# Patient Record
Sex: Male | Born: 1960 | Race: White | Hispanic: No | Marital: Married | Smoking: Never smoker
Health system: Southern US, Community
[De-identification: ages and names within clinical notes are randomized; demographics above are authoritative.]

## PROBLEM LIST (undated history)

## (undated) DIAGNOSIS — T7840XA Allergy, unspecified, initial encounter: Secondary | ICD-10-CM

## (undated) DIAGNOSIS — N529 Male erectile dysfunction, unspecified: Secondary | ICD-10-CM

## (undated) DIAGNOSIS — I10 Essential (primary) hypertension: Secondary | ICD-10-CM

## (undated) DIAGNOSIS — M779 Enthesopathy, unspecified: Secondary | ICD-10-CM

## (undated) DIAGNOSIS — K219 Gastro-esophageal reflux disease without esophagitis: Secondary | ICD-10-CM

## (undated) HISTORY — DX: Gastro-esophageal reflux disease without esophagitis: K21.9

## (undated) HISTORY — DX: Allergy, unspecified, initial encounter: T78.40XA

## (undated) HISTORY — PX: TONSILLECTOMY AND ADENOIDECTOMY: SUR1326

## (undated) HISTORY — PX: BICEPS TENDON REPAIR: SHX566

## (undated) HISTORY — PX: OTHER SURGICAL HISTORY: SHX169

## (undated) HISTORY — PX: COLONOSCOPY: SHX174

## (undated) HISTORY — DX: Essential (primary) hypertension: I10

## (undated) HISTORY — DX: Enthesopathy, unspecified: M77.9

## (undated) HISTORY — PX: ROTATOR CUFF REPAIR: SHX139

## (undated) HISTORY — DX: Male erectile dysfunction, unspecified: N52.9

---

## 2006-08-13 ENCOUNTER — Ambulatory Visit (HOSPITAL_BASED_OUTPATIENT_CLINIC_OR_DEPARTMENT_OTHER): Admission: RE | Admit: 2006-08-13 | Discharge: 2006-08-13 | Payer: Self-pay | Admitting: Urology

## 2006-08-13 ENCOUNTER — Encounter (INDEPENDENT_AMBULATORY_CARE_PROVIDER_SITE_OTHER): Payer: Self-pay | Admitting: Specialist

## 2010-07-13 ENCOUNTER — Ambulatory Visit (HOSPITAL_BASED_OUTPATIENT_CLINIC_OR_DEPARTMENT_OTHER): Admission: RE | Admit: 2010-07-13 | Discharge: 2010-07-13 | Payer: Self-pay | Admitting: Urology

## 2010-11-20 LAB — POCT I-STAT, CHEM 8
BUN: 14 mg/dL (ref 6–23)
Creatinine, Ser: 0.9 mg/dL (ref 0.4–1.5)
Glucose, Bld: 116 mg/dL — ABNORMAL HIGH (ref 70–99)
Hemoglobin: 16 g/dL (ref 13.0–17.0)
Potassium: 3.9 mEq/L (ref 3.5–5.1)
Sodium: 138 mEq/L (ref 135–145)
TCO2: 26 mmol/L (ref 0–100)

## 2011-01-25 NOTE — Op Note (Signed)
NAMETEREL, BANN NO.:  [id number]   MEDICAL RECORD NO.:  [id number]          PATIENT TYPE:  AMB   LOCATION:  [facility]                         FACILITY:  [facility]   PHYSICIAN:  Maretta Bees. Vonita Moss, M.D.DATE OF BIRTH:  06-15-1961   DATE OF PROCEDURE:  08/13/2006  DATE OF DISCHARGE:                               OPERATIVE REPORT   PREOPERATIVE DIAGNOSIS:  Penile and thigh warts.   POSTOPERATIVE DIAGNOSIS:  Penile and thigh warts.   PROCEDURE:  Excision and CO2 laser of penile and thigh warts.   SURGEON:  Dr. Larey Dresser.   ANESTHESIA:  General.   INDICATIONS:  This gentleman has had a long history of warts on the  dorsum of the penis and other warts in his inner thighs that bother him  and he wished removal.   PROCEDURE:  The patient brought to the operating room, placed lithotomy  position.  The external genitalia and thighs were prepped with CHD .  With towels were placed in preparation for laser therapy.  Two 5 mm  warts on the dorsum of the penis were excised sharply and the incisional  sites closed with 4-0 chromic catgut.  There was another wart at the  base of the penis on the right that measured about 6 mm in size and was  treated with CO2 laser.  10 other warts on both inner thighs were then  treated with the CO2 laser and completely destroyed, The biopsy and  laser sites were then covered with antibiotic ointment and the patient  was taken to recovery in good condition with essentially no blood loss  and having tolerated the procedure well.      Maretta Bees. Vonita Moss, M.D.  Electronically Signed     LJP/MEDQ  D:  08/13/2006  T:  08/13/2006  Job:  [id number]

## 2011-09-19 ENCOUNTER — Ambulatory Visit (INDEPENDENT_AMBULATORY_CARE_PROVIDER_SITE_OTHER): Payer: 59

## 2011-09-19 DIAGNOSIS — J019 Acute sinusitis, unspecified: Secondary | ICD-10-CM

## 2011-09-19 DIAGNOSIS — R05 Cough: Secondary | ICD-10-CM

## 2011-09-19 DIAGNOSIS — J40 Bronchitis, not specified as acute or chronic: Secondary | ICD-10-CM

## 2011-10-25 ENCOUNTER — Encounter: Payer: Self-pay | Admitting: *Deleted

## 2011-10-25 DIAGNOSIS — M779 Enthesopathy, unspecified: Secondary | ICD-10-CM

## 2011-10-28 ENCOUNTER — Encounter: Payer: Self-pay | Admitting: Internal Medicine

## 2012-03-09 ENCOUNTER — Encounter: Payer: 59 | Admitting: Internal Medicine

## 2012-06-22 ENCOUNTER — Encounter: Payer: 59 | Admitting: Internal Medicine

## 2012-08-18 ENCOUNTER — Ambulatory Visit (INDEPENDENT_AMBULATORY_CARE_PROVIDER_SITE_OTHER): Payer: 59 | Admitting: Internal Medicine

## 2012-08-18 VITALS — BP 132/84 | HR 74 | Temp 98.1°F | Resp 18 | Ht 63.75 in | Wt 200.0 lb

## 2012-08-18 DIAGNOSIS — J329 Chronic sinusitis, unspecified: Secondary | ICD-10-CM

## 2012-08-18 DIAGNOSIS — R05 Cough: Secondary | ICD-10-CM

## 2012-08-18 DIAGNOSIS — Z0000 Encounter for general adult medical examination without abnormal findings: Secondary | ICD-10-CM

## 2012-08-18 DIAGNOSIS — R059 Cough, unspecified: Secondary | ICD-10-CM

## 2012-08-18 DIAGNOSIS — J40 Bronchitis, not specified as acute or chronic: Secondary | ICD-10-CM

## 2012-08-18 LAB — COMPREHENSIVE METABOLIC PANEL
ALT: 18 U/L (ref 0–53)
AST: 20 U/L (ref 0–37)
Albumin: 4.7 g/dL (ref 3.5–5.2)
Alkaline Phosphatase: 60 U/L (ref 39–117)
Potassium: 4 mEq/L (ref 3.5–5.3)
Sodium: 134 mEq/L — ABNORMAL LOW (ref 135–145)
Total Bilirubin: 1.3 mg/dL — ABNORMAL HIGH (ref 0.3–1.2)
Total Protein: 6.9 g/dL (ref 6.0–8.3)

## 2012-08-18 LAB — TSH: TSH: 4.424 u[IU]/mL (ref 0.350–4.500)

## 2012-08-18 LAB — LIPID PANEL
Cholesterol: 194 mg/dL (ref 0–200)
LDL Cholesterol: 109 mg/dL — ABNORMAL HIGH (ref 0–99)
Total CHOL/HDL Ratio: 3.2 Ratio
VLDL: 25 mg/dL (ref 0–40)

## 2012-08-18 LAB — POCT CBC
Granulocyte percent: 79.6 %G (ref 37–80)
Lymph, poc: 1.4 (ref 0.6–3.4)
MCHC: 32.1 g/dL (ref 31.8–35.4)
MPV: 8.6 fL (ref 0–99.8)
POC Granulocyte: 8.7 — AB (ref 2–6.9)
POC LYMPH PERCENT: 12.6 %L (ref 10–50)
POC MID %: 7.8 %M (ref 0–12)
Platelet Count, POC: 288 10*3/uL (ref 142–424)
RDW, POC: 12.1 %

## 2012-08-18 MED ORDER — HYDROCODONE-ACETAMINOPHEN 7.5-325 MG/15ML PO SOLN: 5.0000 mL | Freq: Four times a day (QID) | ORAL | Status: DC | PRN

## 2012-08-18 MED ORDER — AZITHROMYCIN 500 MG PO TABS: 500.0000 mg | ORAL_TABLET | Freq: Every day | ORAL | Status: DC

## 2012-08-18 NOTE — Patient Instructions (Signed)
Sinusitis Sinusitis is redness, soreness, and swelling (inflammation) of the paranasal sinuses. Paranasal sinuses are air pockets within the bones of your face (beneath the eyes, the middle of the forehead, or above the eyes). In healthy paranasal sinuses, mucus is able to drain out, and air is able to circulate through them by way of your nose. However, when your paranasal sinuses are inflamed, mucus and air can become trapped. This can allow bacteria and other germs to grow and cause infection. Sinusitis can develop quickly and last only a short time (acute) or continue over a long period (chronic). Sinusitis that lasts for more than 12 weeks is considered chronic.  CAUSES  Causes of sinusitis include:  Allergies.  Structural abnormalities, such as displacement of the cartilage that separates your nostrils (deviated septum), which can decrease the air flow through your nose and sinuses and affect sinus drainage.  Functional abnormalities, such as when the small hairs (cilia) that line your sinuses and help remove mucus do not work properly or are not present. SYMPTOMS  Symptoms of acute and chronic sinusitis are the same. The primary symptoms are pain and pressure around the affected sinuses. Other symptoms include:  Upper toothache.  Earache.  Headache.  Bad breath.  Decreased sense of smell and taste.  A cough, which worsens when you are lying flat.  Fatigue.  Fever.  Thick drainage from your nose, which often is green and may contain pus (purulent).  Swelling and warmth over the affected sinuses. DIAGNOSIS  Your caregiver will perform a physical exam. During the exam, your caregiver may:  Look in your nose for signs of abnormal growths in your nostrils (nasal polyps).  Tap over the affected sinus to check for signs of infection.  View the inside of your sinuses (endoscopy) with a special imaging device with a light attached (endoscope), which is inserted into your  sinuses. If your caregiver suspects that you have chronic sinusitis, one or more of the following tests may be recommended:  Allergy tests.  Nasal culture A sample of mucus is taken from your nose and sent to a lab and screened for bacteria.  Nasal cytology A sample of mucus is taken from your nose and examined by your caregiver to determine if your sinusitis is related to an allergy. TREATMENT  Most cases of acute sinusitis are related to a viral infection and will resolve on their own within 10 days. Sometimes medicines are prescribed to help relieve symptoms (pain medicine, decongestants, nasal steroid sprays, or saline sprays).  However, for sinusitis related to a bacterial infection, your caregiver will prescribe antibiotic medicines. These are medicines that will help kill the bacteria causing the infection.  Rarely, sinusitis is caused by a fungal infection. In theses cases, your caregiver will prescribe antifungal medicine. For some cases of chronic sinusitis, surgery is needed. Generally, these are cases in which sinusitis recurs more than 3 times per year, despite other treatments. HOME CARE INSTRUCTIONS   Drink plenty of water. Water helps thin the mucus so your sinuses can drain more easily.  Use a humidifier.  Inhale steam 3 to 4 times a day (for example, sit in the bathroom with the shower running).  Apply a warm, moist washcloth to your face 3 to 4 times a day, or as directed by your caregiver.  Use saline nasal sprays to help moisten and clean your sinuses.  Take over-the-counter or prescription medicines for pain, discomfort, or fever only as directed by your caregiver. SEEK IMMEDIATE MEDICAL   CARE IF:  You have increasing pain or severe headaches.  You have nausea, vomiting, or drowsiness.  You have swelling around your face.  You have vision problems. You have a stiff neck.Bronchitis Bronchitis is the body's way of reacting to injury and/or infection  (inflammation) of the bronchi. Bronchi are the air tubes that extend from the windpipe into the lungs. If the inflammation becomes severe, it may cause shortness of breath. CAUSES  Inflammation may be caused by: A virus. Germs (bacteria). Dust. Allergens. Pollutants and many other irritants. The cells lining the bronchial tree are covered with tiny hairs (cilia). These constantly beat upward, away from the lungs, toward the mouth. This keeps the lungs free of pollutants. When these cells become too irritated and are unable to do their job, mucus begins to develop. This causes the characteristic cough of bronchitis. The cough clears the lungs when the cilia are unable to do their job. Without either of these protective mechanisms, the mucus would settle in the lungs. Then you would develop pneumonia. Smoking is a common cause of bronchitis and can contribute to pneumonia. Stopping this habit is the single most important thing you can do to help yourself. TREATMENT  Your caregiver may prescribe an antibiotic if the cough is caused by bacteria. Also, medicines that open up your airways make it easier to breathe. Your caregiver may also recommend or prescribe an expectorant. It will loosen the mucus to be coughed up. Only take over-the-counter or prescription medicines for pain, discomfort, or fever as directed by your caregiver. Removing whatever causes the problem (smoking, for example) is critical to preventing the problem from getting worse. Cough suppressants may be prescribed for relief of cough symptoms. Inhaled medicines may be prescribed to help with symptoms now and to help prevent problems from returning. For those with recurrent (chronic) bronchitis, there may be a need for steroid medicines. SEEK IMMEDIATE MEDICAL CARE IF:  During treatment, you develop more pus-like mucus (purulent sputum). You have a fever. Your baby is older than 3 months with a rectal temperature of 102 F (38.9 C)  or higher. Your baby is 34 months old or younger with a rectal temperature of 100.4 F (38 C) or higher. You become progressively more ill. You have increased difficulty breathing, wheezing, or shortness of breath. It is necessary to seek immediate medical care if you are elderly or sick from any other disease. MAKE SURE YOU:  Understand these instructions. Will watch your condition. Will get help right away if you are not doing well or get worse. Document Released: 08/26/2005 Document Revised: 11/18/2011 Document Reviewed: 07/05/2008 [facility] Patient Information 2013 [place], [place].    You have difficulty breathing. MAKE SURE YOU:   Understand these instructions.  Will watch your condition.  Will get help right away if you are not doing well or get worse. Document Released: 08/26/2005 Document Revised: 11/18/2011 Document Reviewed: 09/10/2011 [facility] Patient Information 2013 [place], [place].

## 2012-08-18 NOTE — Progress Notes (Signed)
  Subjective:    Patient ID: Neva Seat, male    DOB: 1960/09/23, 51 y.o.   MRN: [id number]  HPI Cough with green sputum,facial pain and foul sinus discharge. No sob, cp, dizzyness.   Review of Systems     Objective:   Physical Exam  Constitutional: He is oriented to person, place, and time. He appears well-developed and well-nourished.  HENT:  Right Ear: External ear normal.  Left Ear: External ear normal.  Nose: Mucosal edema, rhinorrhea and sinus tenderness present. Right sinus exhibits maxillary sinus tenderness and frontal sinus tenderness. Left sinus exhibits maxillary sinus tenderness and frontal sinus tenderness.  Mouth/Throat: Oropharynx is clear and moist.  Neck: Normal range of motion.  Cardiovascular: Normal rate, regular rhythm and normal heart sounds.   Pulmonary/Chest: Effort normal. He has no decreased breath sounds. He has no wheezes. He has rhonchi in the right upper field, the left upper field and the left lower field.  Musculoskeletal: Normal range of motion.  Neurological: He is alert and oriented to person, place, and time.  Skin: Skin is warm.  Psychiatric: He has a normal mood and affect.          Assessment & Plan:  Sinusitis/bronchitis CPE Friday/ do lab work today

## 2012-08-21 ENCOUNTER — Ambulatory Visit (INDEPENDENT_AMBULATORY_CARE_PROVIDER_SITE_OTHER): Payer: 59 | Admitting: Internal Medicine

## 2012-08-21 VITALS — BP 112/72 | HR 70 | Temp 98.0°F | Resp 18 | Ht 68.0 in | Wt 200.0 lb

## 2012-08-21 DIAGNOSIS — Z7189 Other specified counseling: Secondary | ICD-10-CM

## 2012-08-21 DIAGNOSIS — N529 Male erectile dysfunction, unspecified: Secondary | ICD-10-CM

## 2012-08-21 DIAGNOSIS — I10 Essential (primary) hypertension: Secondary | ICD-10-CM

## 2012-08-21 DIAGNOSIS — J40 Bronchitis, not specified as acute or chronic: Secondary | ICD-10-CM

## 2012-08-21 DIAGNOSIS — Z0000 Encounter for general adult medical examination without abnormal findings: Secondary | ICD-10-CM

## 2012-08-21 HISTORY — DX: Essential (primary) hypertension: I10

## 2012-08-21 LAB — POCT URINALYSIS DIPSTICK
Ketones, UA: NEGATIVE
Leukocytes, UA: NEGATIVE
Protein, UA: NEGATIVE
Urobilinogen, UA: 0.2
pH, UA: 5.5

## 2012-08-21 LAB — POCT UA - MICROSCOPIC ONLY
Casts, Ur, LPF, POC: NEGATIVE
Mucus, UA: NEGATIVE
Yeast, UA: NEGATIVE

## 2012-08-21 MED ORDER — SILDENAFIL CITRATE 100 MG PO TABS: 100.0000 mg | ORAL_TABLET | ORAL | Status: DC | PRN

## 2012-08-21 MED ORDER — LISINOPRIL 20 MG PO TABS: 20.0000 mg | ORAL_TABLET | Freq: Every day | ORAL | Status: DC

## 2012-08-21 NOTE — Progress Notes (Signed)
  Subjective:    Patient ID: Blake Smith, male    DOB: 01/07/1961, 51 y.o.   MRN: [id number]  HPI Lost 40 lbs, feels great. Cholesterol was high last year, now it is normal! Bronchitis is resolving.   Review of Systems  Constitutional: Negative.   HENT: Negative.   Eyes: Negative.   Respiratory: Negative.   Cardiovascular: Negative.   Gastrointestinal: Negative.   Genitourinary: Negative.   Musculoskeletal: Negative.   Neurological: Negative.   Hematological: Negative.   Psychiatric/Behavioral: Negative.        Objective:   Physical Exam  Vitals reviewed. Constitutional: He is oriented to person, place, and time. He appears well-developed and well-nourished.  HENT:  Right Ear: External ear normal.  Left Ear: External ear normal.  Nose: Nose normal.  Mouth/Throat: Oropharynx is clear and moist.  Eyes: EOM are normal. Pupils are equal, round, and reactive to light.  Neck: Normal range of motion. No tracheal deviation present. No thyromegaly present.  Cardiovascular: Normal rate, regular rhythm, normal heart sounds and intact distal pulses.   Pulmonary/Chest: Effort normal and breath sounds normal.  Abdominal: Soft. He exhibits no mass. There is no tenderness.  Genitourinary: Rectum normal, prostate normal and penis normal.  Musculoskeletal: Normal range of motion.  Neurological: He is alert and oriented to person, place, and time. He has normal reflexes. No cranial nerve deficit. He exhibits normal muscle tone. Coordination normal.  Skin: Skin is warm. No rash noted.  Psychiatric: He has a normal mood and affect. His behavior is normal. Judgment and thought content normal.    EKG      Assessment & Plan:  Lab work reviewed and is normal RF viagra, dced hctz Lisinopril 20mg  qd new bp med

## 2012-08-21 NOTE — Patient Instructions (Addendum)

## 2012-09-10 ENCOUNTER — Ambulatory Visit (INDEPENDENT_AMBULATORY_CARE_PROVIDER_SITE_OTHER): Payer: 59 | Admitting: Physician Assistant

## 2012-09-10 VITALS — BP 142/86 | HR 94 | Temp 99.3°F | Resp 18 | Ht 68.0 in | Wt 209.4 lb

## 2012-09-10 DIAGNOSIS — J3489 Other specified disorders of nose and nasal sinuses: Secondary | ICD-10-CM

## 2012-09-10 DIAGNOSIS — R05 Cough: Secondary | ICD-10-CM

## 2012-09-10 DIAGNOSIS — R0981 Nasal congestion: Secondary | ICD-10-CM

## 2012-09-10 DIAGNOSIS — J329 Chronic sinusitis, unspecified: Secondary | ICD-10-CM

## 2012-09-10 LAB — POCT INFLUENZA A/B: Influenza A, POC: NEGATIVE

## 2012-09-10 MED ORDER — LEVOFLOXACIN 500 MG PO TABS: 500.0000 mg | ORAL_TABLET | Freq: Every day | ORAL | Status: DC

## 2012-09-10 MED ORDER — BENZONATATE 200 MG PO CAPS: 200.0000 mg | ORAL_CAPSULE | Freq: Three times a day (TID) | ORAL | Status: DC | PRN

## 2012-09-10 MED ORDER — IPRATROPIUM BROMIDE 0.06 % NA SOLN: 2.0000 | Freq: Three times a day (TID) | NASAL | Status: DC

## 2012-09-10 NOTE — Progress Notes (Signed)
Patient ID: TEJAS SEAWOOD MRN: [id number], DOB: Jul 17, 1961, 52 y.o. Date of Encounter: 09/10/2012, 12:16 PM  Primary Physician: Tally Due, MD  Chief Complaint:  Chief Complaint  Patient presents with  . Cough    1 week  . Sinusitis    HPI: 52 y.o. year old male presents with a seven day history of nasal congestion, post nasal drip, sore throat, sinus pressure, and cough. Afebrile. No chills. Nasal congestion thick and green/yellow. Cough is productive of green/yellow sputum and not associated with time of day. No wheezing, shortness of breath, or chest pain. Increasing sinus pressure. Ears feel full, leading to sensation of muffled hearing. Has tried OTC cold preps without success. No GI complaints. Appetite normal. Several sick contacts, some with influenza. No recent antibiotics or recent travels. No leg trauma, sedentary periods, h/o cancer, or tobacco use.  Past Medical History  Diagnosis Date  . Hypertension   . Erectile dysfunction   . Bone spur     dorssal distal  R foot     Home Meds: Prior to Admission medications   Medication Sig Start Date End Date Taking? Authorizing Provider  lisinopril (PRINIVIL,ZESTRIL) 20 MG tablet Take 1 tablet (20 mg total) by mouth daily. 08/21/12  Yes Jonita Albee, MD  sildenafil (VIAGRA) 100 MG tablet Take 1 tablet (100 mg total) by mouth as needed. 08/21/12  Yes Jonita Albee, MD                                Allergies: No Known Allergies  History   Social History  . Marital Status: Single    Spouse Name: N/A    Number of Children: N/A  . Years of Education: N/A   Occupational History  . firefighter [employer]   Social History Main Topics  . Smoking status: Never Smoker   . Smokeless tobacco: Not on file  . Alcohol Use: 0.5 oz/week    1 drink(s) per week  . Drug Use: No  . Sexually Active: Yes   Other Topics Concern  . Not on file   Social History Narrative  . No narrative on file     Review  of Systems: Constitutional: negative for chills, fever, night sweats or weight changes HEENT: see above Cardiovascular: negative for chest pain or palpitations Respiratory: positive for cough. negative for hemoptysis, wheezing, or shortness of breath Abdominal: negative for abdominal pain, nausea, vomiting or diarrhea Dermatological: negative for rash Neurologic: negative for headache   Physical Exam: Blood pressure 142/86, pulse 94, temperature 99.3 F (37.4 C), temperature source Oral, resp. rate 18, height 5\' 8"  (1.727 m), weight 209 lb 6.4 oz (94.983 kg), SpO2 99.00%., Body mass index is 31.84 kg/(m^2). General: Well developed, well nourished, in no acute distress. Head: Normocephalic, atraumatic, eyes without discharge, sclera non-icteric, nares are congested. Bilateral auditory canals clear, TM's are without perforation, pearly grey with reflective cone of light bilaterally. Serous effusion bilaterally behind TM's. Maxillary sinus TTP. Oral cavity moist, dentition normal. Posterior pharynx with post nasal drip and mild erythema. No peritonsillar abscess or tonsillar exudate. Uvula midline Neck: Supple. No thyromegaly. Full ROM. No lymphadenopathy. Lungs: Coarse breath sounds bilaterally without wheezes, rales, or rhonchi. Breathing is unlabored.  Heart: RRR with S1 S2. No murmurs, rubs, or gallops appreciated. Msk:  Strength and tone normal for age. Extremities: No clubbing or cyanosis. No edema. Neuro: Alert and oriented X 3.  Moves all extremities spontaneously. CNII-XII grossly in tact. Psych:  Responds to questions appropriately with a normal affect.   Labs: Results for orders placed in visit on 09/10/12  POCT INFLUENZA A/B      Component Value Range   Influenza A, POC Negative     Influenza B, POC Negative       ASSESSMENT AND PLAN:  52 y.o. year old male with sinobronchitis, cough, and nasal congesiton -Levaquin 500 mg 1 po daily #10 no RF -Atrovent NS 0.06% 2 sprays  each nare bid prn #1 no RF -Tessalon Perles 200 mg 1 po tid prn cough #40 no RF  -Mucinex -Tylenol/Motrin prn -Rest/fluids -RTC precautions -RTC 3-5 days if no improvement  Elinor Dodge, PA-C 09/10/2012 12:16 PM

## 2012-10-02 ENCOUNTER — Telehealth: Payer: Self-pay | Admitting: Radiology

## 2012-10-02 NOTE — Telephone Encounter (Signed)
Patient was here for his physical with you last month, had lost 40 lbs, was feeling good. 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

## 2012-10-02 NOTE — Telephone Encounter (Signed)
Dr Perrin Maltese has signed form, I have faxed it and mailed it to the patient.

## 2012-11-28 ENCOUNTER — Emergency Department (HOSPITAL_COMMUNITY)
Admission: EM | Admit: 2012-11-28 | Discharge: 2012-11-28 | Disposition: A | Discharge status: Home / Self Care | Payer: 59 | Attending: Emergency Medicine | Admitting: Emergency Medicine

## 2012-11-28 ENCOUNTER — Other Ambulatory Visit: Payer: Self-pay

## 2012-11-28 ENCOUNTER — Encounter (HOSPITAL_COMMUNITY): Payer: Self-pay | Admitting: Unknown Physician Specialty

## 2012-11-28 ENCOUNTER — Emergency Department (HOSPITAL_COMMUNITY): Payer: 59

## 2012-11-28 DIAGNOSIS — Z8739 Personal history of other diseases of the musculoskeletal system and connective tissue: Secondary | ICD-10-CM | POA: Insufficient documentation

## 2012-11-28 DIAGNOSIS — R61 Generalized hyperhidrosis: Secondary | ICD-10-CM | POA: Insufficient documentation

## 2012-11-28 DIAGNOSIS — Z79899 Other long term (current) drug therapy: Secondary | ICD-10-CM | POA: Insufficient documentation

## 2012-11-28 DIAGNOSIS — R0789 Other chest pain: Secondary | ICD-10-CM | POA: Insufficient documentation

## 2012-11-28 DIAGNOSIS — Z87448 Personal history of other diseases of urinary system: Secondary | ICD-10-CM | POA: Insufficient documentation

## 2012-11-28 DIAGNOSIS — R42 Dizziness and giddiness: Secondary | ICD-10-CM | POA: Insufficient documentation

## 2012-11-28 DIAGNOSIS — I10 Essential (primary) hypertension: Secondary | ICD-10-CM | POA: Insufficient documentation

## 2012-11-28 LAB — PROTIME-INR
INR: 0.95 (ref 0.00–1.49)
Prothrombin Time: 12.6 seconds (ref 11.6–15.2)

## 2012-11-28 LAB — POCT I-STAT, CHEM 8
BUN: 14 mg/dL (ref 6–23)
Calcium, Ion: 1.19 mmol/L (ref 1.12–1.23)
Chloride: 107 mEq/L (ref 96–112)
HCT: 48 % (ref 39.0–52.0)
Potassium: 4.4 mEq/L (ref 3.5–5.1)
Sodium: 141 mEq/L (ref 135–145)

## 2012-11-28 LAB — CBC WITH DIFFERENTIAL/PLATELET
Basophils Relative: 0 % (ref 0–1)
Lymphs Abs: 0.8 10*3/uL (ref 0.7–4.0)
MCH: 32.3 pg (ref 26.0–34.0)
MCV: 86.8 fL (ref 78.0–100.0)
Monocytes Absolute: 0.6 10*3/uL (ref 0.1–1.0)
Monocytes Relative: 6 % (ref 3–12)
Neutro Abs: 7.9 10*3/uL — ABNORMAL HIGH (ref 1.7–7.7)
Platelets: 188 10*3/uL (ref 150–400)
RDW: 12 % (ref 11.5–15.5)
WBC: 9.4 10*3/uL (ref 4.0–10.5)

## 2012-11-28 LAB — POCT I-STAT TROPONIN I: Troponin i, poc: 0 ng/mL (ref 0.00–0.08)

## 2012-11-28 MED ORDER — NITROGLYCERIN 0.4 MG SL SUBL
0.4000 mg | SUBLINGUAL_TABLET | SUBLINGUAL | Status: DC | PRN
  Administered 2012-11-28 (×2): 0.4 mg via SUBLINGUAL

## 2012-11-28 MED ORDER — SODIUM CHLORIDE 0.9 % IV BOLUS (SEPSIS)
1000.0000 mL | Freq: Once | INTRAVENOUS | Status: AC
  Administered 2012-11-28: 1000 mL via INTRAVENOUS

## 2012-11-28 MED ORDER — SODIUM CHLORIDE 0.9 % IV BOLUS (SEPSIS)
1000.0000 mL | INTRAVENOUS | Status: AC
  Administered 2012-11-28: 1000 mL via INTRAVENOUS

## 2012-11-28 MED ORDER — NITROGLYCERIN 0.4 MG SL SUBL
SUBLINGUAL_TABLET | SUBLINGUAL | Status: AC
  Filled 2012-11-28: qty 25

## 2012-11-28 MED ORDER — ASPIRIN 81 MG PO CHEW
324.0000 mg | CHEWABLE_TABLET | Freq: Once | ORAL | Status: AC
  Administered 2012-11-28: 324 mg via ORAL
  Filled 2012-11-28: qty 4

## 2012-11-28 NOTE — ED Notes (Signed)
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 Patient states he feels pressure in his left chest, unchanged with inspiration. He states he feels lightheaded.

## 2012-11-28 NOTE — Discharge Instructions (Signed)
Your testing today has been normal.  We have give you some IV fluids because your blood pressure and heart rate changed significantly when you were standing which may have been making you light headed.  Your heart testing and EKG were also normal.  If you should develop severe or worsening symptoms return to the ER immediately.  You should call your doctor Monday morning for a recheck.   Rest for 24 hours and drink plenty of fluids.

## 2012-11-28 NOTE — ED Provider Notes (Signed)
History     CSN: [id number]  Arrival date & time 11/28/12  1124   First MD Initiated Contact with Patient 11/28/12 1124      Chief Complaint  Patient presents with  . Chest Pain    (Consider location/radiation/quality/duration/timing/severity/associated sxs/prior treatment) HPI Comments: 52 year old male with a history of hypertension and takes lisinopril presents with a complaint of chest pain. 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 The symptoms have been persistent, gradually improving, nothing seems to make it better or worse. He has not had any associated fevers chills nausea vomiting coughing blurred vision or headache. He does have an associated diaphoresis that started when his symptoms started. He has had no swelling in his legs, no rashes, no diarrhea or dysuria. He endorses exercising vigorously 3 times a week and never has to sit down or stop with that. 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  The patient denies a history of diabetes, smoking cigarettes, high cholesterol or having coronary disease. He does endorse a history of occasional alcohol use.  Patient is a 52 y.o. male presenting with chest pain. The history is provided by the patient and the EMS personnel.  Chest Pain   Past Medical History  Diagnosis Date  . Hypertension   . Erectile dysfunction   . Bone spur     dorssal distal  R foot    Past Surgical History  Procedure Laterality Date  . Rotator cuff repair      bilateral  . Undescended testicle      lesion  penis removed  52 y/o  . Tonsillectomy and adenoidectomy      Family History  Problem Relation Age of Onset  . Hypertension Father   . Diabetes Father   . Stroke Father 70    History  Substance Use Topics  . Smoking status: Never Smoker   . Smokeless tobacco: Not on file  . Alcohol Use: 0.5 oz/week    1 drink(s) per week      Review of Systems  Cardiovascular: Positive for chest pain.  All other systems reviewed and are negative.    Allergies  Review of patient's allergies indicates no known allergies.  Home Medications   Current Outpatient Rx  Name  Route  Sig  Dispense  Refill  . lisinopril (PRINIVIL,ZESTRIL) 20 MG tablet   Oral   Take 20 mg by mouth daily.         . sildenafil (VIAGRA) 100 MG tablet   Oral   Take 100 mg by mouth daily as needed for erectile dysfunction.           BP 160/95  Pulse 77  Temp(Src) 97.9 F (36.6 C)  Resp 16  SpO2 100%  Physical Exam  Nursing note and vitals reviewed. Constitutional: He appears well-developed and well-nourished. No distress.  HENT:  Head: Normocephalic and atraumatic.  Mouth/Throat: Oropharynx is clear and moist. No oropharyngeal exudate.  Eyes: Conjunctivae and EOM are normal. Pupils  are equal, round, and reactive to light. Right eye exhibits no discharge. Left eye exhibits no discharge. No scleral icterus.  Neck: Normal range of motion. Neck supple. No JVD present. No thyromegaly present.  Cardiovascular: Normal rate, regular rhythm, normal heart sounds and intact distal pulses.  Exam reveals no gallop and no friction rub.   No murmur heard. Pulmonary/Chest: Effort normal and breath sounds normal. No respiratory distress. He has no wheezes. He has no rales.  Abdominal: Soft. Bowel sounds are normal. He exhibits no distension and no mass. There is no tenderness.  Musculoskeletal: Normal range of motion. He exhibits no edema and no tenderness.  Lymphadenopathy:    He has no cervical adenopathy.   Neurological: He is alert. Coordination normal.  Skin: Skin is warm and dry. No rash noted. No erythema.  Psychiatric: He has a normal mood and affect. His behavior is normal.    ED Course  Procedures (including critical care time)  Labs Reviewed  CBC WITH DIFFERENTIAL - Abnormal; Notable for the following:    MCHC 37.3 (*)    Neutrophils Relative 84 (*)    Neutro Abs 7.9 (*)    Lymphocytes Relative 9 (*)    All other components within normal limits  POCT I-STAT, CHEM 8 - Abnormal; Notable for the following:    Glucose, Bld 122 (*)    All other components within normal limits  APTT  PROTIME-INR  HEPATIC FUNCTION PANEL  URINALYSIS, ROUTINE W REFLEX MICROSCOPIC  POCT I-STAT TROPONIN I   Dg Chest Port 1 View  11/28/2012  *RADIOLOGY REPORT*  Clinical Data: Chest pain  PORTABLE CHEST - 1 VIEW  Comparison: None.  Findings: Cardiomediastinal silhouette is unremarkable.  No acute infiltrate or pleural effusion.  No pulmonary edema.  Bony thorax is unremarkable.  IMPRESSION: No active disease.   Original Report Authenticated By: Natasha Mead, M.D.      1. Sherlynn Stalls headed       MDM  At this time the patient has a normal cardiac and pulmonary exam, no peripheral edema or asymmetry and no wrist factors for pulmonary embolism. His only risk factor for acute coronary syndrome his hypertension which seems to be relatively well-controlled at baseline. He has not missed any of his medications, he has equal pulses at the bilateral radial arteries and no focal neurologic deficits. The rest of his vital signs appear unremarkable, there is no tachycardia or fever or hypoxia.  Chest x-ray, laboratory workup pending, EKG nonischemic and normal.  ED ECG REPORT  I personally interpreted this EKG   Date: 11/28/2012   Rate: 71  Rhythm: normal sinus rhythm  QRS Axis: normal  Intervals: normal  ST/T Wave abnormalities: normal  Conduction Disutrbances:none  Narrative Interpretation:   Old EKG Reviewed:  none available But compared with paramedic rhythm strips, no changes   The patient has a normal workup, no leukocytosis, no anemia, normal troponin, normal electrolytes and kidney function.initially his orthostatic vital signs suggested that he had a rise in his heart rate from 62-93 with standing and he was given IV fluids. After 1 L of IV fluids he again stopped and had minimal change in his vital signs and stated that he felt much better. He is not have any focal neurologic deficits, no visual change problems, no nystagmus and clear heart and lungs. His EKG is nonischemic and at this time with his very low risk factor for acute coronary syndrome can be discharged home safely to follow up with his family doctor.  He is chest pain-free, headache free, has no complaints. He has expressed his understanding for the indications for followup.      Vida Roller, MD 11/28/12 [phone]

## 2013-09-13 ENCOUNTER — Ambulatory Visit (INDEPENDENT_AMBULATORY_CARE_PROVIDER_SITE_OTHER): Payer: 59 | Admitting: Internal Medicine

## 2013-09-13 VITALS — BP 162/98 | HR 77 | Temp 97.8°F | Resp 16 | Ht 68.0 in | Wt 213.0 lb

## 2013-09-13 DIAGNOSIS — I10 Essential (primary) hypertension: Secondary | ICD-10-CM

## 2013-09-13 DIAGNOSIS — N529 Male erectile dysfunction, unspecified: Secondary | ICD-10-CM

## 2013-09-13 DIAGNOSIS — B009 Herpesviral infection, unspecified: Secondary | ICD-10-CM

## 2013-09-13 LAB — POCT URINALYSIS DIPSTICK
Bilirubin, UA: NEGATIVE
Blood, UA: NEGATIVE
Glucose, UA: NEGATIVE
Ketones, UA: NEGATIVE
LEUKOCYTES UA: NEGATIVE
Nitrite, UA: NEGATIVE
PROTEIN UA: NEGATIVE
Spec Grav, UA: 1.025
UROBILINOGEN UA: 0.2
pH, UA: 5.5

## 2013-09-13 LAB — BASIC METABOLIC PANEL
BUN: 13 mg/dL (ref 6–23)
CALCIUM: 9.8 mg/dL (ref 8.4–10.5)
CO2: 30 mEq/L (ref 19–32)
CREATININE: 0.88 mg/dL (ref 0.50–1.35)
Chloride: 103 mEq/L (ref 96–112)
Glucose, Bld: 102 mg/dL — ABNORMAL HIGH (ref 70–99)
Potassium: 5.1 mEq/L (ref 3.5–5.3)
SODIUM: 140 meq/L (ref 135–145)

## 2013-09-13 MED ORDER — LISINOPRIL 20 MG PO TABS: 20.0000 mg | ORAL_TABLET | Freq: Every day | ORAL | Status: DC

## 2013-09-13 MED ORDER — SILDENAFIL CITRATE 100 MG PO TABS: 100.0000 mg | ORAL_TABLET | Freq: Every day | ORAL | Status: DC | PRN

## 2013-09-13 MED ORDER — ACYCLOVIR 800 MG PO TABS: 800.0000 mg | ORAL_TABLET | Freq: Two times a day (BID) | ORAL | Status: DC

## 2013-09-13 NOTE — Progress Notes (Signed)
   Subjective:    Patient ID: Blake Smith, male    DOB: 07-Mar-1961, 53 y.o.   MRN: [id number]  HPI    Review of Systems     Objective:   Physical Exam        Assessment & Plan:

## 2013-09-13 NOTE — Patient Instructions (Addendum)
Hypertension As your heart beats, it forces blood through your arteries. This force is your blood pressure. If the pressure is too high, it is called hypertension (HTN) or high blood pressure. HTN is dangerous because you may have it and not know it. High blood pressure may mean that your heart has to work harder to pump blood. Your arteries may be narrow or stiff. The extra work puts you at risk for heart disease, stroke, and other problems.  Blood pressure consists of two numbers, a higher number over a lower, 110/72, for example. It is stated as "110 over 72." The ideal is below 120 for the top number (systolic) and under 80 for the bottom (diastolic). Write down your blood pressure today. You should pay close attention to your blood pressure if you have certain conditions such as:  Heart failure.  Prior heart attack.  Diabetes  Chronic kidney disease.  Prior stroke.  Multiple risk factors for heart disease. To see if you have HTN, your blood pressure should be measured while you are seated with your arm held at the level of the heart. It should be measured at least twice. A one-time elevated blood pressure reading (especially in the Emergency Department) does not mean that you need treatment. There may be conditions in which the blood pressure is different between your right and left arms. It is important to see your caregiver soon for a recheck. Most people have essential hypertension which means that there is not a specific cause. This type of high blood pressure may be lowered by changing lifestyle factors such as:  Stress.  Smoking.  Lack of exercise.  Excessive weight.  Drug/tobacco/alcohol use.  Eating less salt. Most people do not have symptoms from high blood pressure until it has caused damage to the body. Effective treatment can often prevent, delay or reduce that damage. TREATMENT  When a cause has been identified, treatment for high blood pressure is directed at the  cause. There are a large number of medications to treat HTN. These fall into several categories, and your caregiver will help you select the medicines that are best for you. Medications may have side effects. You should review side effects with your caregiver. If your blood pressure stays high after you have made lifestyle changes or started on medicines,   Your medication(s) may need to be changed.  Other problems may need to be addressed.  Be certain you understand your prescriptions, and know how and when to take your medicine.  Be sure to follow up with your caregiver within the time frame advised (usually within two weeks) to have your blood pressure rechecked and to review your medications.  If you are taking more than one medicine to lower your blood pressure, make sure you know how and at what times they should be taken. Taking two medicines at the same time can result in blood pressure that is too low. SEEK IMMEDIATE MEDICAL CARE IF:  You develop a severe headache, blurred or changing vision, or confusion.  You have unusual weakness or numbness, or a faint feeling.  You have severe chest or abdominal pain, vomiting, or breathing problems. MAKE SURE YOU:   Understand these instructions.  Will watch your condition.  Will get help right away if you are not doing well or get worse. Document Released: 08/26/2005 Document Revised: 11/18/2011 Document Reviewed: 04/15/2008 [facility] Patient Information 2014 [place]. Erectile Dysfunction Erectile dysfunction is the inability to get or sustain a good enough erection to  have sexual intercourse. Erectile dysfunction may involve:  Inability to get an erection.  Lack of enough hardness to allow penetration.  Loss of the erection before sex is finished.  Premature ejaculation. CAUSES  Certain drugs, such as:  Pain relievers.  Antihistamines.  Antidepressants.  Blood pressure medicines.  Water pills  (diuretics).  Ulcer medicines.  Muscle relaxants.  Illegal drugs.  Excessive drinking.  Psychological causes, such as:  Anxiety.  Depression.  Sadness.  Exhaustion.  Performance fear.  Stress.  Physical causes, such as:  Artery problems. This may include diabetes, smoking, liver disease, or atherosclerosis.  High blood pressure.  Hormonal problems, such as low testosterone.  Obesity.  Nerve problems. This may include back or pelvic injuries, diabetes mellitus, multiple sclerosis, or Parkinson disease. SYMPTOMS  Inability to get an erection.  Lack of enough hardness to allow penetration.  Loss of the erection before sex is finished.  Premature ejaculation.  Normal erections at some times, but with frequent unsatisfactory episodes.  Orgasms that are not satisfactory in sensation or frequency.  Low sexual satisfaction in either partner because of erection problems.  A curved penis occurring with erection. The curve may cause pain or may be too curved to allow for intercourse.  Never having nighttime erections. DIAGNOSIS Your caregiver can often diagnose this condition by:  Performing a physical exam to find other diseases or specific problems with the penis.  Asking you detailed questions about the problem.  Performing blood tests to check for diabetes mellitus or to measure hormone levels.  Performing urine tests to find other underlying health conditions.  Performing an ultrasound exam to check for scarring.  Performing a test to check blood flow to the penis.  Doing a sleep study at home to measure nighttime erections. TREATMENT   You may be prescribed medicines by mouth.  You may be given medicine injections into the penis.  You may be prescribed a vacuum pump with a ring.  Penile implant surgery may be performed. You may receive:  An inflatable implant.  A semirigid implant.  Blood vessel surgery may be performed. HOME CARE  INSTRUCTIONS  If you are prescribed oral medicine, you should take the medicine as prescribed. Do not increase the dosage without first discussing it with your physician.  If you are using self-injections, be careful to avoid any veins that are on the surface of the penis. Apply pressure to the injection site for 5 minutes.  If you are using a vacuum pump, make sure you have read the instructions before using it. Discuss any questions with your physician before taking the pump home. SEEK MEDICAL CARE IF:  You experience pain that is not responsive to the pain medicine you have been prescribed.  You experience nausea or vomiting. SEEK IMMEDIATE MEDICAL CARE IF:   When taking oral or injectable medications, you experience an erection that lasts longer than 4 hours. If your physician is unavailable, go to the nearest emergency room for evaluation. An erection that lasts much longer than 4 hours can result in permanent damage to your penis.  You have pain that is severe.  You develop redness, severe pain, or severe swelling of your penis.  You have redness spreading up into your groin or lower abdomen.  You are unable to pass your urine. Document Released: 08/23/2000 Document Revised: 04/28/2013 Document Reviewed: 01/28/2013 [facility] Patient Information 2014 [place]. Cold Sore A cold sore (fever blister) is a skin infection caused by the herpes simplex virus (HSV-1). HSV-1  is closely related to the virus that causes gential herpes (HSV-2), but they are not the same even though both viruses can cause oral and genital infections. Cold sores are small, fluid-filled sores inside of the mouth or on the lips, gums, nose, chin, cheeks, or fingers.  The herpes simplex virus can be easily passed (contagious) to other people through close personal contact, such as kissing or sharing personal items. The virus can also spread to other parts of the body, such as the eyes or genitals. Cold sores are  contagious until the sores crust over completely. They often heal within 2 weeks.  Once a person is infected, the herpes simplex virus remains permanently in the body. Therefore, there is no cure for cold sores, and they often recur when a person is tired, stressed, sick, or gets too much sun. Additional factors that can cause a recurrence include hormone changes in menstruation or pregnancy, certain drugs, and cold weather.  CAUSES  Cold sores are caused by the herpes simplex virus. The virus is spread from person to person through close contact, such as through kissing, touching the affected area, or sharing personal items such as lip balm, razors, or eating utensils.  SYMPTOMS  The first infection may not cause symptoms. If symptoms develop, the symptoms often go through different stages. Here is how a cold sore develops:   Tingling, itching, or burning is felt 1 2 days before the outbreak.   Fluid-filled blisters appear on the lips, inside the mouth, nose, or on the cheeks.   The blisters start to ooze clear fluid.   The blisters dry up and a yellow crust appears in its place.   The crust falls off.  Symptoms depend on whether it is the initial outbreak or a recurrence. Some other symptoms with the first outbreak may include:   Fever.   Sore throat.   Headache.   Muscle aches.   Swollen neck glands.  DIAGNOSIS  A diagnosis is often made based on your symptoms and looking at the sores. Sometimes, a sore may be swabbed and then examined in the lab to make a final diagnosis. If the sores are not present, blood tests can find the herpes simplex virus.  TREATMENT  There is no cure for cold sores and no vaccine for the herpes simplex virus. Within 2 weeks, most cold sores go away on their own without treatment. Medicines cannot make the infection go away, but medicine can help relieve some of the pain associated with the sores, can work to stop the virus from multiplying, and  can also shorten healing time. Medicine may be in the form of creams, gels, pills, or a shot.  HOME CARE INSTRUCTIONS   Only take over-the-counter or prescription medicines for pain, discomfort, or fever as directed by your caregiver. Do not use aspirin.   Use a cotton-tip swab to apply creams or gels to your sores.   Do not touch the sores or pick the scabs. Wash your hands often. Do not touch your eyes without washing your hands first.   Avoid kissing, oral sex, and sharing personal items until sores heal.   Apply an ice pack on your sores for 10 15 minutes to ease any discomfort.   Avoid hot, cold, or salty foods because they may hurt your mouth. Eat a soft, bland diet to avoid irritating the sores. Use a straw to drink if you have pain when drinking out of a glass.   Keep sores clean and  dry to prevent an infection of other tissues.   Avoid the sun and limit stress if these things trigger outbreaks. If sun causes cold sores, apply sunscreen on the lips before being out in the sun.  SEEK MEDICAL CARE IF:   You have a fever or persistent symptoms for more than 2 3 days.   You have a fever and your symptoms suddenly get worse.   You have pus, not clear fluid, coming from the sores.   You have redness that is spreading.   You have pain or irritation in your eye.   You get sores on your genitals.   Your sores do not heal within 2 weeks.   You have a weakened immune system.   You have frequent recurrences of cold sores.  MAKE SURE YOU:   Understand these instructions.  Will watch your condition.  Will get help right away if you are not doing well or get worse. Document Released: 08/23/2000 Document Revised: 05/20/2012 Document Reviewed: 01/08/2012 [facility] Patient Information 2014 [place].

## 2013-09-13 NOTE — Progress Notes (Signed)
   Subjective:    Patient ID: Blake Smith, male    DOB: 1960/11/18, 53 y.o.   MRN: [id number]  HPI Pt here for refills on medication lisinopril and also Viagra .  He has been depressed and stress due to his dad passing away August 21 2013.  No other complaints. Pt is fasting this morning. Last physical October 2014.  Ran out of medicine August 20, 2013 Had cpe at work with all tests 10/14 and ETT all normal   Review of Systems     Objective:   Physical Exam  Constitutional: He is oriented to person, place, and time. He appears well-developed and well-nourished.  HENT:  Head: Normocephalic.  Eyes: EOM are normal.  Neck: Neck supple.  Cardiovascular: Normal rate.   Pulmonary/Chest: Effort normal.  Lymphadenopathy:    He has no cervical adenopathy.  Neurological: He is alert and oriented to person, place, and time. He exhibits normal muscle tone. Coordination normal.  Psychiatric: He has a normal mood and affect.     Results for orders placed in visit on 09/13/13  POCT URINALYSIS DIPSTICK      Result Value Range   Color, UA yellow     Clarity, UA clear     Glucose, UA neg     Bilirubin, UA neg     Ketones, UA neg     Spec Grav, UA 1.025     Blood, UA neg     pH, UA 5.5     Protein, UA neg     Urobilinogen, UA 0.2     Nitrite, UA neg     Leukocytes, UA Negative           Assessment & Plan:  RF meds 13yr HTN/ED/Fever blisters Trial acyclovir

## 2013-10-07 ENCOUNTER — Telehealth: Payer: Self-pay | Admitting: Radiology

## 2013-10-07 NOTE — Telephone Encounter (Signed)
Labs faxed to [facility] for the MRI scan today.

## 2013-12-12 IMAGING — CR DG CHEST 1V PORT
1 series · 1 of 1 positions shown · non-contrast
Comparison: None.

CLINICAL DATA: Chest pain

PORTABLE CHEST - 1 VIEW

[AP]
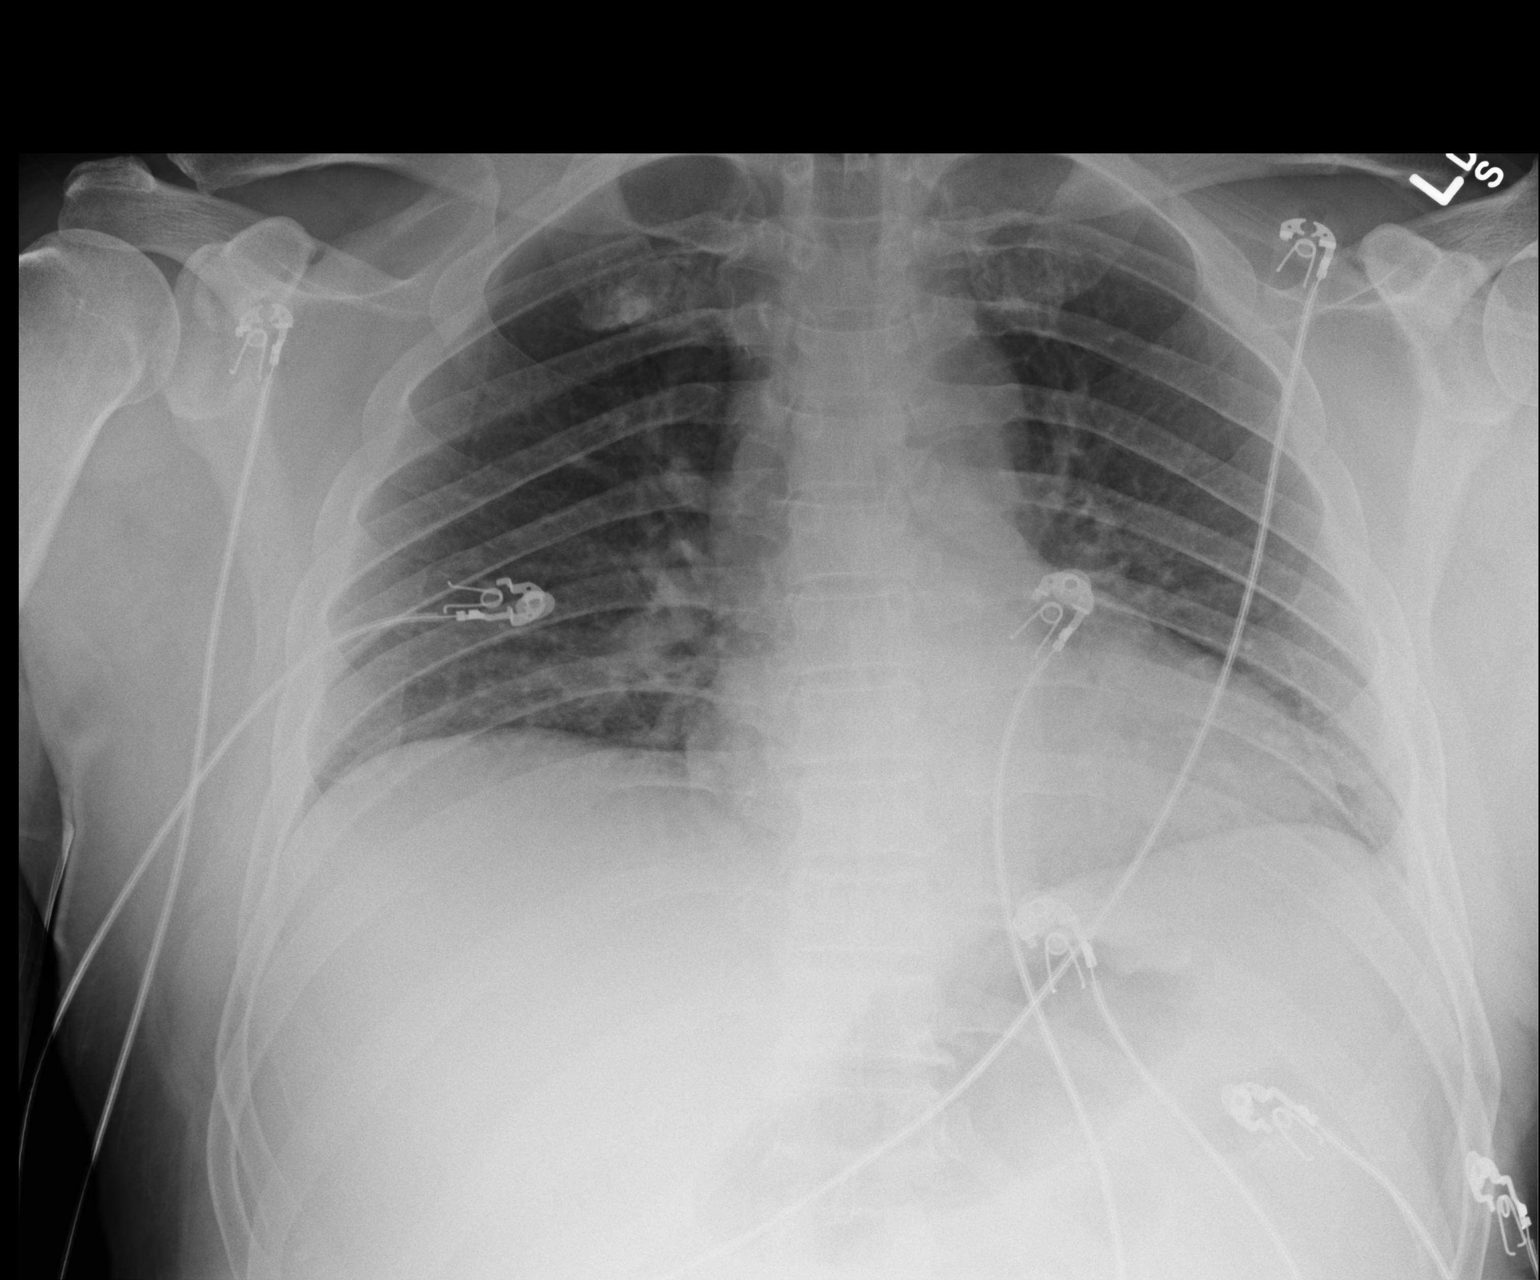

[1 of 1 positions shown; findings below may reference images not displayed]

FINDINGS: Cardiomediastinal silhouette is unremarkable.  No acute
infiltrate or pleural effusion.  No pulmonary edema.  Bony thorax
is unremarkable.
IMPRESSION: No active disease.

## 2014-09-22 ENCOUNTER — Other Ambulatory Visit: Payer: Self-pay | Admitting: Internal Medicine

## 2014-11-18 ENCOUNTER — Other Ambulatory Visit: Payer: Self-pay | Admitting: Dermatology

## 2015-12-12 ENCOUNTER — Ambulatory Visit (INDEPENDENT_AMBULATORY_CARE_PROVIDER_SITE_OTHER): Payer: 59 | Admitting: Emergency Medicine

## 2015-12-12 VITALS — BP 152/98 | HR 67 | Temp 97.4°F | Resp 16 | Ht 68.5 in | Wt 228.0 lb

## 2015-12-12 DIAGNOSIS — N528 Other male erectile dysfunction: Secondary | ICD-10-CM | POA: Diagnosis not present

## 2015-12-12 DIAGNOSIS — Z125 Encounter for screening for malignant neoplasm of prostate: Secondary | ICD-10-CM | POA: Diagnosis not present

## 2015-12-12 DIAGNOSIS — I10 Essential (primary) hypertension: Secondary | ICD-10-CM | POA: Diagnosis not present

## 2015-12-12 LAB — BASIC METABOLIC PANEL WITH GFR
BUN: 16 mg/dL (ref 7–25)
CHLORIDE: 101 mmol/L (ref 98–110)
CO2: 26 mmol/L (ref 20–31)
Calcium: 9.6 mg/dL (ref 8.6–10.3)
Creat: 0.83 mg/dL (ref 0.70–1.33)
GFR, Est African American: >89 mL/min (ref >=60)
Glucose, Bld: 94 mg/dL (ref 65–99)
POTASSIUM: 4.1 mmol/L (ref 3.5–5.3)
SODIUM: 138 mmol/L (ref 135–146)

## 2015-12-12 LAB — POCT CBC
Granulocyte percent: 68.5 %G (ref 37–80)
HCT, POC: 43.7 % (ref 43.5–53.7)
Hemoglobin: 16.1 g/dL (ref 14.1–18.1)
LYMPH, POC: 1.8 (ref 0.6–3.4)
MCH, POC: 33.2 pg — AB (ref 27–31.2)
MCHC: 36.9 g/dL — AB (ref 31.8–35.4)
MCV: 89.9 fL (ref 80–97)
MID (cbc): 0.5 (ref 0–0.9)
MPV: 6.9 fL (ref 0–99.8)
PLATELET COUNT, POC: 194 10*3/uL (ref 142–424)
POC Granulocyte: 4.9 (ref 2–6.9)
POC LYMPH %: 24.8 % (ref 10–50)
POC MID %: 6.7 %M (ref 0–12)
RBC: 4.86 M/uL (ref 4.69–6.13)
RDW, POC: 12.3 %
WBC: 7.1 10*3/uL (ref 4.6–10.2)

## 2015-12-12 MED ORDER — LISINOPRIL 20 MG PO TABS: ORAL_TABLET | ORAL | Status: DC

## 2015-12-12 MED ORDER — SILDENAFIL CITRATE 100 MG PO TABS: 100.0000 mg | ORAL_TABLET | Freq: Every day | ORAL | Status: DC | PRN

## 2015-12-12 NOTE — Patient Instructions (Addendum)
   IF you received an x-ray today, you will receive an invoice from [facility]. Please contact  Radiology at [phone] with questions or concerns regarding your invoice.   IF you received labwork today, you will receive an invoice from [facility]. Please contact [facility] at [phone] with questions or concerns regarding your invoice.   Our billing staff will not be able to assist you with questions regarding bills from these companies.  You will be contacted with the lab results as soon as they are available. The fastest way to get your results is to activate your My Chart account. Instructions are located on the last page of this paperwork. If you have not heard from us regarding the results in 2 weeks, please contact this office.     DASH Eating Plan DASH stands for "Dietary Approaches to Stop Hypertension." The DASH eating plan is a healthy eating plan that has been shown to reduce high blood pressure (hypertension). Additional health benefits may include reducing the risk of type 2 diabetes mellitus, heart disease, and stroke. The DASH eating plan may also help with weight loss. WHAT DO I NEED TO KNOW ABOUT THE DASH EATING PLAN? For the DASH eating plan, you will follow these general guidelines:  Choose foods with a percent daily value for sodium of less than 5% (as listed on the food label).  Use salt-free seasonings or herbs instead of table salt or sea salt.  Check with your health care provider or pharmacist before using salt substitutes.  Eat lower-sodium products, often labeled as "lower sodium" or "no salt added."  Eat fresh foods.  Eat more vegetables, fruits, and low-fat dairy products.  Choose whole grains. Look for the word "whole" as the first word in the ingredient list.  Choose fish and skinless chicken or turkey more often than red meat. Limit fish, poultry, and meat to 6 oz (170 g) each day.  Limit sweets,  desserts, sugars, and sugary drinks.  Choose heart-healthy fats.  Limit cheese to 1 oz (28 g) per day.  Eat more home-cooked food and less restaurant, buffet, and fast food.  Limit fried foods.  Cook foods using methods other than frying.  Limit canned vegetables. If you do use them, rinse them well to decrease the sodium.  When eating at a restaurant, ask that your food be prepared with less salt, or no salt if possible. WHAT FOODS CAN I EAT? Seek help from a dietitian for individual calorie needs. Grains Whole grain or whole wheat bread. Brown rice. Whole grain or whole wheat pasta. Quinoa, bulgur, and whole grain cereals. Low-sodium cereals. Corn or whole wheat flour tortillas. Whole grain cornbread. Whole grain crackers. Low-sodium crackers. Vegetables Fresh or frozen vegetables (raw, steamed, roasted, or grilled). Low-sodium or reduced-sodium tomato and vegetable juices. Low-sodium or reduced-sodium tomato sauce and paste. Low-sodium or reduced-sodium canned vegetables.  Fruits All fresh, canned (in natural juice), or frozen fruits. Meat and Other Protein Products Ground beef (85% or leaner), grass-fed beef, or beef trimmed of fat. Skinless chicken or turkey. Ground chicken or turkey. Pork trimmed of fat. All fish and seafood. Eggs. Dried beans, peas, or lentils. Unsalted nuts and seeds. Unsalted canned beans. Dairy Low-fat dairy products, such as skim or 1% milk, 2% or reduced-fat cheeses, low-fat ricotta or cottage cheese, or plain low-fat yogurt. Low-sodium or reduced-sodium cheeses. Fats and Oils Tub margarines without trans fats. Light or reduced-fat mayonnaise and salad dressings (reduced sodium). Avocado. Safflower, olive, or canola   oils. Natural peanut or almond butter. Other Unsalted popcorn and pretzels. The items listed above may not be a complete list of recommended foods or beverages. Contact your dietitian for more options. WHAT FOODS ARE NOT  RECOMMENDED? Grains White bread. White pasta. White rice. Refined cornbread. Bagels and croissants. Crackers that contain trans fat. Vegetables Creamed or fried vegetables. Vegetables in a cheese sauce. Regular canned vegetables. Regular canned tomato sauce and paste. Regular tomato and vegetable juices. Fruits Dried fruits. Canned fruit in light or heavy syrup. Fruit juice. Meat and Other Protein Products Fatty cuts of meat. Ribs, chicken wings, bacon, sausage, bologna, salami, chitterlings, fatback, hot dogs, bratwurst, and packaged luncheon meats. Salted nuts and seeds. Canned beans with salt. Dairy Whole or 2% milk, cream, half-and-half, and cream cheese. Whole-fat or sweetened yogurt. Full-fat cheeses or blue cheese. Nondairy creamers and whipped toppings. Processed cheese, cheese spreads, or cheese curds. Condiments Onion and garlic salt, seasoned salt, table salt, and sea salt. Canned and packaged gravies. Worcestershire sauce. Tartar sauce. Barbecue sauce. Teriyaki sauce. Soy sauce, including reduced sodium. Steak sauce. Fish sauce. Oyster sauce. Cocktail sauce. Horseradish. Ketchup and mustard. Meat flavorings and tenderizers. Bouillon cubes. Hot sauce. Tabasco sauce. Marinades. Taco seasonings. Relishes. Fats and Oils Butter, stick margarine, lard, shortening, ghee, and bacon fat. Coconut, palm kernel, or palm oils. Regular salad dressings. Other Pickles and olives. Salted popcorn and pretzels. The items listed above may not be a complete list of foods and beverages to avoid. Contact your dietitian for more information. WHERE CAN I FIND MORE INFORMATION? National Heart, Lung, and [employer] Institute: www.nhlbi.nih.gov/health/health-topics/topics/dash/   This information is not intended to replace advice given to you by your health care provider. Make sure you discuss any questions you have with your health care provider.   Document Released: 08/15/2011 Document Revised: 09/16/2014  Document Reviewed: 06/30/2013 [employer] Interactive Patient Education 2016 [employer]  

## 2015-12-12 NOTE — Progress Notes (Addendum)
By signing my name below, I, Moises Blood, attest that this documentation has been prepared under the direction and in the presence of Arlyss Queen, MD. Electronically Signed: Moises Blood, [place]. 12/12/2015 , 1:33 PM .  Patient was seen in room 8 .  Chief Complaint:  Chief Complaint  Patient presents with  . Hypertension    HPI: Blake Smith is a 55 y.o. male who reports to [facility] today complaining of elevated BP.  His father passed away 2 years ago (in 08/28/13), and he's noticed elevated BP since then. He denies any chest pains, shortness of breath or headache. He's stopped taking his medications for a while now.   He also requested to have a refill on viagra.   He's still working with the Psychologist, occupational. 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   Past Medical History  Diagnosis Date  . Hypertension   . Erectile dysfunction   . Bone spur     dorssal distal  R foot   Past Surgical History  Procedure Laterality Date  . Rotator cuff repair      bilateral  . Undescended testicle      lesion penis removed  55 y/o  . Tonsillectomy and adenoidectomy     Social History   Social History  . Marital Status: Single    Spouse Name: N/A  . Number of Children: N/A  . Years of Education: N/A   Occupational History  . firefighter Unemployed   Social History Main Topics  . Smoking status: Never Smoker   . Smokeless tobacco: None  . Alcohol Use: 0.5 oz/week    1 drink(s) per week  . Drug Use: No  . Sexual Activity: Yes   Other Topics Concern  . None   Social History Narrative   Family History  Problem Relation Age of Onset  . Hypertension Father   . Diabetes Father   . Stroke Father 26   No Known Allergies Prior to Admission medications   Medication Sig Start Date End Date Taking? Authorizing Provider  cetirizine (ZYRTEC) 10 MG tablet Take 10 mg by mouth daily.   Yes Historical Provider, MD  ranitidine (ZANTAC) 150 MG tablet Take 150  mg by mouth 2 (two) times daily.   Yes Historical Provider, MD  acyclovir (ZOVIRAX) 800 MG tablet Take 1 tablet (800 mg total) by mouth 2 (two) times daily. Patient not taking: Reported on 12/12/2015 09/13/13   Orma Flaming, MD  lisinopril (PRINIVIL,ZESTRIL) 20 MG tablet Take 1 tablet (20 mg total) by mouth daily. PATIENT NEEDS AN OFFICE VISIT FOR ADDITIONAL REFILLS. Patient not taking: Reported on 12/12/2015 09/22/14   Orma Flaming, MD  sildenafil (VIAGRA) 100 MG tablet Take 1 tablet (100 mg total) by mouth daily as needed for erectile dysfunction. Patient not taking: Reported on 12/12/2015 09/13/13   Orma Flaming, MD     ROS:  Constitutional: negative for fever, chills, night sweats, weight changes, or fatigue  HEENT: negative for vision changes, hearing loss, congestion, rhinorrhea, ST, epistaxis, or sinus pressure Cardiovascular: negative for chest pain or palpitations Respiratory: negative for hemoptysis, wheezing, shortness of breath, or cough Abdominal: negative for abdominal pain, nausea, vomiting, diarrhea, or constipation Dermatological: negative for rash Neurologic: negative for headache, dizziness, or syncope All other systems reviewed and are otherwise negative with the exception to those above and in the HPI.  PHYSICAL EXAM: Filed Vitals:   12/12/15 1318  BP: 152/98  Pulse: 67  Temp: 97.4 F (36.3 C)  Resp: 16   Body mass index is 34.16 kg/(m^2).   General: Alert, no acute distress HEENT:  Normocephalic, atraumatic, oropharynx patent. Eye: Juliette Mangle [facility] Cardiovascular:  Regular rate and rhythm, no rubs murmurs or gallops.  No Carotid bruits, radial pulse intact. No pedal edema.  Respiratory: Clear to auscultation bilaterally.  No wheezes, rales, or rhonchi.  No cyanosis, no use of accessory musculature Abdominal: No organomegaly, abdomen is soft and non-tender, positive bowel sounds. No masses. Musculoskeletal: Gait intact. No edema, tenderness Skin: No  rashes. Neurologic: Facial musculature symmetric. Psychiatric: Patient acts appropriately throughout our interaction.  Lymphatic: No cervical or submandibular lymphadenopathy Genitourinary/Anorectal: No acute findings   LABS:  Results for orders placed or performed in visit on 12/12/15  POCT CBC  Result Value Ref Range   WBC 7.1 4.6 - 10.2 K/uL   Lymph, poc 1.8 0.6 - 3.4   POC LYMPH PERCENT 24.8 10 - 50 %L   MID (cbc) 0.5 0 - 0.9   POC MID % 6.7 0 - 12 %M   POC Granulocyte 4.9 2 - 6.9   Granulocyte percent 68.5 37 - 80 %G   RBC 4.86 4.69 - 6.13 M/uL   Hemoglobin 16.1 14.1 - 18.1 g/dL   HCT, POC 43.7 43.5 - 53.7 %   MCV 89.9 80 - 97 fL   MCH, POC 33.2 (A) 27 - 31.2 pg   MCHC 36.9 (A) 31.8 - 35.4 g/dL   RDW, POC 12.3 %   Platelet Count, POC 194 142 - 424 K/uL   MPV 6.9 0 - 99.8 fL   EKG/XRAY:     ASSESSMENT/PLAN:  Patient in need of his blood pressure medications. 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 Will check CBC be met and PSA today.I did refill his lisinopril. I did refill his Viagra.I personally performed the services described in this documentation, which was scribed in my presence. The recorded information has been reviewed and is accurate.  Gross sideeffects, risk and benefits, and alternatives of medications d/w patient. Patient is aware that all medications have potential sideeffects and we are unable to predict every sideeffect or drug-drug interaction that may occur.  Arlyss Queen MD 12/12/2015 1:33 PM

## 2015-12-13 LAB — PSA: PSA: 0.43 ng/mL (ref <=4.00)

## 2016-01-09 ENCOUNTER — Telehealth: Payer: Self-pay

## 2016-01-09 ENCOUNTER — Other Ambulatory Visit: Payer: Self-pay | Admitting: Emergency Medicine

## 2016-01-09 DIAGNOSIS — Z1211 Encounter for screening for malignant neoplasm of colon: Secondary | ICD-10-CM

## 2016-01-09 NOTE — Telephone Encounter (Signed)
Patient called about a referral for a colonoscopy.  He said the referral was supposed to have been done about a month ago during his OV with Dr Everlene Farrier.  There is not a referral in the system.  Please advise, thank you.  CB#: [phone]

## 2016-01-09 NOTE — Telephone Encounter (Signed)
Tell patient I have placed the referral. If he has not heard from Dr. Ulyses Amor office within a week to call me

## 2016-01-09 NOTE — Telephone Encounter (Signed)
Dr. Everlene Farrier? Order pended.

## 2016-01-09 NOTE — Telephone Encounter (Signed)
Pt advised.

## 2016-01-10 ENCOUNTER — Encounter: Payer: Self-pay | Admitting: Gastroenterology

## 2016-03-08 ENCOUNTER — Ambulatory Visit (AMBULATORY_SURGERY_CENTER): Payer: Self-pay | Admitting: *Deleted

## 2016-03-08 VITALS — Ht 68.0 in | Wt 230.0 lb

## 2016-03-08 DIAGNOSIS — Z1211 Encounter for screening for malignant neoplasm of colon: Secondary | ICD-10-CM

## 2016-03-08 MED ORDER — NA SULFATE-K SULFATE-MG SULF 17.5-3.13-1.6 GM/177ML PO SOLN: 1.0000 | Freq: Once | ORAL | Status: DC

## 2016-03-08 NOTE — Progress Notes (Signed)
No egg or soy allergy known to patient  No issues with past sedation with any surgeries  or procedures, no intubation problems  No diet pills per patient No home 02 use per patient  No blood thinners per patient  Pt denies issues with constipation  emmi video to e mail  

## 2016-03-12 ENCOUNTER — Encounter: Payer: Self-pay | Admitting: Gastroenterology

## 2016-03-22 ENCOUNTER — Ambulatory Visit (AMBULATORY_SURGERY_CENTER): Payer: 59 | Admitting: Gastroenterology

## 2016-03-22 ENCOUNTER — Encounter: Payer: Self-pay | Admitting: Gastroenterology

## 2016-03-22 VITALS — BP 127/74 | HR 75 | Temp 98.6°F | Resp 17 | Ht 68.0 in | Wt 230.0 lb

## 2016-03-22 DIAGNOSIS — D120 Benign neoplasm of cecum: Secondary | ICD-10-CM

## 2016-03-22 DIAGNOSIS — Z1211 Encounter for screening for malignant neoplasm of colon: Secondary | ICD-10-CM | POA: Diagnosis present

## 2016-03-22 DIAGNOSIS — K639 Disease of intestine, unspecified: Secondary | ICD-10-CM

## 2016-03-22 DIAGNOSIS — K6389 Other specified diseases of intestine: Secondary | ICD-10-CM | POA: Diagnosis not present

## 2016-03-22 MED ORDER — SODIUM CHLORIDE 0.9 % IV SOLN: 500.0000 mL | INTRAVENOUS | Status: DC

## 2016-03-22 NOTE — Progress Notes (Signed)
Called to room to assist during endoscopic procedure.  Patient ID and intended procedure confirmed with present staff. Received instructions for my participation in the procedure from the performing physician.  

## 2016-03-22 NOTE — Op Note (Signed)
Blake Smith Patient Name: Blake Smith Procedure Date: 03/22/2016 8:34 AM MRN: [id number] Endoscopist: Milus Banister , MD Age: 55 Referring MD:  Date of Birth: 02/20/1961 Gender: Male Account #: [id number] Procedure:                Colonoscopy Indications:              Screening for colorectal malignant neoplasm Medicines:                Monitored Anesthesia Care Procedure:                Pre-Anesthesia Assessment:                           - Prior to the procedure, a History and Physical                            was performed, and patient medications and                            allergies were reviewed. The patient's tolerance of                            previous anesthesia was also reviewed. The risks                            and benefits of the procedure and the sedation                            options and risks were discussed with the patient.                            All questions were answered, and informed consent                            was obtained. Prior Anticoagulants: The patient has                            taken no previous anticoagulant or antiplatelet                            agents. ASA Grade Assessment: II - A patient with                            mild systemic disease. After reviewing the risks                            and benefits, the patient was deemed in                            satisfactory condition to undergo the procedure.                           After obtaining informed consent, the colonoscope  was passed under direct vision. Throughout the                            procedure, the patient's blood pressure, pulse, and                            oxygen saturations were monitored continuously. The                            Model CF-HQ190L [id number]) scope was introduced                            through the anus and advanced to the the cecum,                            identified by  appendiceal orifice and ileocecal                            valve. The colonoscopy was performed without                            difficulty. The patient tolerated the procedure                            fairly well. The quality of the bowel preparation                            was excellent. The ileocecal valve, appendiceal                            orifice, and rectum were photographed. Scope In: 8:43:05 AM Scope Out: 8:57:39 AM Scope Withdrawal Time: 0 hours 12 minutes 25 seconds  Total Procedure Duration: 0 hours 14 minutes 34 seconds  Findings:                 A 4 mm polyp was found in the cecum. The polyp was                            sessile. The polyp was removed with a cold snare.                            Resection and retrieval were complete.                           A localized area of mildly erythematous mucosa was                            found in the sigmoid colon (likely diverticular                            associated edema) Biopsies were taken with a cold                            forceps for  histology.                           A few small-mouthed diverticula were found in the                            left colon.                           The exam was otherwise without abnormality on                            direct and retroflexion views. Complications:            No immediate complications. Estimated blood loss:                            None. Estimated Blood Loss:     Estimated blood loss: none. Impression:               - One 4 mm polyp in the cecum, removed with a cold                            snare. Resected and retrieved.                           - Erythematous mucosa in the sigmoid colon (likely                            diverticular associated edema). Biopsied.                           - Diverticulosis in the left colon.                           - The examination was otherwise normal on direct                            and  retroflexion views. Recommendation:           - Patient has a contact number available for                            emergencies. The signs and symptoms of potential                            delayed complications were discussed with the                            patient. Return to normal activities tomorrow.                            Written discharge instructions were provided to the                            patient.                           -  Resume previous diet.                           - Continue present medications.                           You will receive a letter within 2-3 weeks with the                            pathology results and my final recommendations.                           If the polyp(s) is proven to be 'pre-cancerous' on                            pathology, you will need repeat colonoscopy in 5                            years. If the polyp(s) is NOT 'precancerous' on                            pathology then you should repeat colon cancer                            screening in 10 years with colonoscopy without need                            for colon cancer screening by any method prior to                            then (including stool testing). Milus Banister, MD 03/22/2016 9:00:47 AM This report has been signed electronically.

## 2016-03-22 NOTE — Patient Instructions (Signed)
YOU HAD AN ENDOSCOPIC PROCEDURE TODAY AT THE [facility]:   Refer to the procedure report that was given to you for any specific questions about what was found during the examination.  If the procedure report does not answer your questions, please call your gastroenterologist to clarify.  If you requested that your care partner not be given the details of your procedure findings, then the procedure report has been included in a sealed envelope for you to review at your convenience later.  YOU SHOULD EXPECT: Some feelings of bloating in the abdomen. Passage of more gas than usual.  Walking can help get rid of the air that was put into your GI tract during the procedure and reduce the bloating. If you had a lower endoscopy (such as a colonoscopy or flexible sigmoidoscopy) you may notice spotting of blood in your stool or on the toilet paper. If you underwent a bowel prep for your procedure, you may not have a normal bowel movement for a few days.  Please Note:  You might notice some irritation and congestion in your nose or some drainage.  This is from the oxygen used during your procedure.  There is no need for concern and it should clear up in a day or so.  SYMPTOMS TO REPORT IMMEDIATELY:   Following lower endoscopy (colonoscopy or flexible sigmoidoscopy):  Excessive amounts of blood in the stool  Significant tenderness or worsening of abdominal pains  Swelling of the abdomen that is new, acute  Fever of 100F or higher  For urgent or emergent issues, a gastroenterologist can be reached at any hour by calling [phone].   DIET: Your first meal following the procedure should be a small meal and then it is ok to progress to your normal diet. Heavy or fried foods are harder to digest and may make you feel nauseous or bloated.  Likewise, meals heavy in dairy and vegetables can increase bloating.  Drink plenty of fluids but you should avoid alcoholic beverages for 24  hours.  ACTIVITY:  You should plan to take it easy for the rest of today and you should NOT DRIVE or use heavy machinery until tomorrow (because of the sedation medicines used during the test).    FOLLOW UP: Our staff will call the number listed on your records the next business day following your procedure to check on you and address any questions or concerns that you may have regarding the information given to you following your procedure. If we do not reach you, we will leave a message.  However, if you are feeling well and you are not experiencing any problems, there is no need to return our call.  We will assume that you have returned to your regular daily activities without incident.  If any biopsies were taken you will be contacted by phone or by letter within the next 1-3 weeks.  Please call us at [phone] if you have not heard about the biopsies in 3 weeks.    SIGNATURES/CONFIDENTIALITY: You and/or your care partner have signed paperwork which will be entered into your electronic medical record.  These signatures attest to the fact that that the information above on your After Visit Summary has been reviewed and is understood.  Full responsibility of the confidentiality of this discharge information lies with you and/or your care-partner.  Please review polyp, diverticulosis, and high fiber diet handouts provided. 

## 2016-03-25 ENCOUNTER — Telehealth: Payer: Self-pay

## 2016-03-25 NOTE — Telephone Encounter (Signed)
  Follow up Call-  Call back number 03/22/2016  Post procedure Call Back phone  # [phone]  Permission to leave phone message Yes    Patient was called for follow up after his procedure on 03/22/2016. No answer at the number given for follow up phone call. A message was left on the answering machine.

## 2016-04-02 ENCOUNTER — Encounter: Payer: Self-pay | Admitting: Gastroenterology

## 2016-12-11 ENCOUNTER — Other Ambulatory Visit: Payer: Self-pay | Admitting: Emergency Medicine

## 2016-12-12 ENCOUNTER — Other Ambulatory Visit: Payer: Self-pay | Admitting: Emergency Medicine

## 2016-12-12 DIAGNOSIS — I10 Essential (primary) hypertension: Secondary | ICD-10-CM

## 2016-12-12 DIAGNOSIS — N528 Other male erectile dysfunction: Secondary | ICD-10-CM

## 2016-12-13 NOTE — Telephone Encounter (Signed)
12/2015 last ov 

## 2017-09-19 ENCOUNTER — Encounter: Payer: Self-pay | Admitting: Physician Assistant

## 2017-09-19 ENCOUNTER — Telehealth: Payer: Self-pay

## 2017-09-19 ENCOUNTER — Ambulatory Visit: Payer: 59 | Admitting: Physician Assistant

## 2017-09-19 ENCOUNTER — Other Ambulatory Visit: Payer: Self-pay

## 2017-09-19 VITALS — BP 154/98 | HR 87 | Temp 98.3°F | Resp 16 | Ht 68.0 in | Wt 238.0 lb

## 2017-09-19 DIAGNOSIS — N529 Male erectile dysfunction, unspecified: Secondary | ICD-10-CM | POA: Diagnosis not present

## 2017-09-19 DIAGNOSIS — B009 Herpesviral infection, unspecified: Secondary | ICD-10-CM | POA: Diagnosis not present

## 2017-09-19 DIAGNOSIS — H02834 Dermatochalasis of left upper eyelid: Secondary | ICD-10-CM

## 2017-09-19 DIAGNOSIS — H02831 Dermatochalasis of right upper eyelid: Secondary | ICD-10-CM | POA: Diagnosis not present

## 2017-09-19 DIAGNOSIS — I10 Essential (primary) hypertension: Secondary | ICD-10-CM | POA: Diagnosis not present

## 2017-09-19 MED ORDER — LISINOPRIL 20 MG PO TABS: 20.0000 mg | ORAL_TABLET | Freq: Every day | ORAL | 3 refills | Status: DC

## 2017-09-19 MED ORDER — ACYCLOVIR 800 MG PO TABS: 800.0000 mg | ORAL_TABLET | Freq: Two times a day (BID) | ORAL | 3 refills | Status: DC

## 2017-09-19 MED ORDER — SILDENAFIL CITRATE 100 MG PO TABS: 50.0000 mg | ORAL_TABLET | Freq: Every day | ORAL | 11 refills | Status: DC | PRN

## 2017-09-19 NOTE — Patient Instructions (Addendum)
  Monitor you BP closely for the next three weeks.  If your pressure is not going below 140/90 when you check at home after about three weeks then please call the office so I can change you medication.    IF you received an x-ray today, you will receive an invoice from [facility]. Please contact [facility] at [phone] with questions or concerns regarding your invoice.   IF you received labwork today, you will receive an invoice from [facility]. Please contact [facility] at [phone] with questions or concerns regarding your invoice.   Our billing staff will not be able to assist you with questions regarding bills from these companies.  You will be contacted with the lab results as soon as they are available. The fastest way to get your results is to activate your My Chart account. Instructions are located on the last page of this paperwork. If you have not heard from [place] regarding the results in 2 weeks, please contact this office.

## 2017-09-19 NOTE — Progress Notes (Addendum)
09/19/2017 3:21 PM   DOB: [birth year] / MRN: [id number]  SUBJECTIVE:  Blake Smith is a 57 y.o. male presenting med refills.    Has a history of HTN and has not had any lisinopril since November and while taking this his pressure would measure roughly 130/80.   Tells me that me that he gets fever blisters on his lips with sun exposure and takes acyclovir with excellent reuslts.   Takes ranitidine for GERD daily and tells me this is working well.  No dysphagia or odynophagia.   Complains of "baggy upper eyelids." Has been present now for a few years and is getting worse. Has a difficulty time with vision in the upper fields at times and tells me, "its like the my eye lids are pushing my eyes closed. Want to have something done about this.   He has No Known Allergies.   He  has a past medical history of Allergy, Bone spur, Erectile dysfunction, GERD (gastroesophageal reflux disease), and Hypertension.    He  reports that  has never smoked. His smokeless tobacco use includes snuff. He reports that he drinks about 0.6 oz of alcohol per week. He reports that he does not use drugs. He  reports that he currently engages in sexual activity. The patient  has a past surgical history that includes Rotator cuff repair; undescended testicle; Tonsillectomy and adenoidectomy; and Biceps tendon repair.  His family history includes Diabetes in his father; Hypertension in his father; Stroke (age of onset: 74) in his father.  Review of Systems  Constitutional: Negative for chills, diaphoresis and fever.  Eyes: Negative.   Respiratory: Negative for cough, hemoptysis, sputum production, shortness of breath and wheezing.   Cardiovascular: Negative for chest pain, orthopnea and leg swelling.  Gastrointestinal: Negative for abdominal pain, blood in stool, constipation, diarrhea, heartburn, melena, nausea and vomiting.  Genitourinary: Negative for flank pain.  Skin: Negative for rash.  Neurological:  Negative for dizziness, sensory change, speech change, focal weakness and headaches.    The problem list and medications were reviewed and updated by myself where necessary and exist elsewhere in the encounter.   OBJECTIVE:  BP (!) 154/98   Pulse 87   Temp 98.3 F (36.8 C) (Oral)   Resp 16   Ht 5\' 8"  (1.727 m)   Wt 238 lb (108 kg)   SpO2 97%   BMI 36.19 kg/m   Physical Exam  Constitutional: He is oriented to person, place, and time. He appears well-developed. He is active and cooperative.  Non-toxic appearance.  HENT:  Right Ear: Hearing, tympanic membrane, external ear and ear canal normal.  Left Ear: Hearing, tympanic membrane, external ear and ear canal normal.  Nose: Nose normal. Right sinus exhibits no maxillary sinus tenderness and no frontal sinus tenderness. Left sinus exhibits no maxillary sinus tenderness and no frontal sinus tenderness.  Mouth/Throat: Uvula is midline, oropharynx is clear and moist and mucous membranes are normal. No oropharyngeal exudate, posterior oropharyngeal edema or tonsillar abscesses.  Eyes: Conjunctivae and EOM are normal. Pupils are equal, round, and reactive to light.    Cardiovascular: Normal rate, regular rhythm, S1 normal, S2 normal, normal heart sounds, intact distal pulses and normal pulses. Exam reveals no gallop and no friction rub.  No murmur heard. Pulmonary/Chest: Effort normal. No stridor. No tachypnea. No respiratory distress. He has no wheezes. He has no rales.  Abdominal: He exhibits no distension.  Musculoskeletal: He exhibits no edema.  Lymphadenopathy:  Head (right side): No submandibular and no tonsillar adenopathy present.       Head (left side): No submandibular and no tonsillar adenopathy present.    He has no cervical adenopathy.  Neurological: He is alert and oriented to person, place, and time. He has normal strength and normal reflexes. He is not disoriented. No cranial nerve deficit or sensory deficit. He  exhibits normal muscle tone. Coordination and gait normal.  Skin: Skin is warm and dry. He is not diaphoretic. No pallor.  Psychiatric: His behavior is normal.  Vitals reviewed.    Pulse Readings from Last 3 Encounters:  09/19/17 87  03/22/16 75  12/12/15 67   BP Readings from Last 3 Encounters:  09/19/17 (!) 154/98  03/22/16 127/74  12/12/15 (!) 152/98   Wt Readings from Last 3 Encounters:  09/19/17 238 lb (108 kg)  03/22/16 230 lb (104.3 kg)  03/08/16 230 lb (104.3 kg)    Lab Results  Component Value Date   CREATININE 0.83 12/12/2015   BUN 16 12/12/2015   NA 138 12/12/2015   K 4.1 12/12/2015   CL 101 12/12/2015   CO2 26 12/12/2015   Lab Results  Component Value Date   CHOL 194 08/18/2012   HDL 60 08/18/2012   LDLCALC 109 (H) 08/18/2012   TRIG 127 08/18/2012   CHOLHDL 3.2 08/18/2012    No results found for this or any previous visit (from the past 72 hour(s)).  No results found.  ASSESSMENT AND PLAN:  Blake Smith was seen today for establish care.  Diagnoses and all orders for this visit:  Hypertension -     Discontinue: lisinopril (PRINIVIL,ZESTRIL) 20 MG tablet; Take 1 tablet (20 mg total) by mouth daily. Office visit needed for additional refills. 1st notice. -     CMP and Liver -     CBC -     Hemoglobin A1c -     Lipid Panel -     lisinopril (PRINIVIL,ZESTRIL) 20 MG tablet; Take 1 tablet (20 mg total) by mouth daily.  HSV (herpes simplex virus) infection -     acyclovir (ZOVIRAX) 800 MG tablet; Take 1 tablet (800 mg total) by mouth 2 (two) times daily.  Dermatochalasis of both upper eyelids -     Ambulatory referral to Plastic Surgery  Erectile dysfunction -     sildenafil (VIAGRA) 100 MG tablet; Take 0.5-1 tablets (50-100 mg total) by mouth daily as needed for erectile dysfunction.    The patient is advised to call or return to clinic if he does not see an improvement in symptoms, or to seek the care of the closest emergency department if he worsens  with the above plan.   Philis Fendt, MHS, PA-C [facility] 09/19/2017 3:21 PM

## 2017-09-19 NOTE — Telephone Encounter (Signed)
Pt req appt to refill meds.  Hasn't been seen since 05/2016 - wanted appt with Dr Everlene Farrier. Req appt 1/15-1/18/2019.    Called pt.  He had already made appt with [facility] for today.

## 2017-09-20 LAB — CBC
HEMOGLOBIN: 17.1 g/dL (ref 13.0–17.7)
Hematocrit: 47.8 % (ref 37.5–51.0)
MCH: 32.4 pg (ref 26.6–33.0)
MCHC: 35.8 g/dL — ABNORMAL HIGH (ref 31.5–35.7)
MCV: 91 fL (ref 79–97)
Platelets: 244 10*3/uL (ref 150–379)
RBC: 5.27 x10E6/uL (ref 4.14–5.80)
RDW: 12.7 % (ref 12.3–15.4)
WBC: 7.5 10*3/uL (ref 3.4–10.8)

## 2017-09-20 LAB — LIPID PANEL
CHOLESTEROL TOTAL: 230 mg/dL — AB (ref 100–199)
Chol/HDL Ratio: 4.7 ratio (ref 0.0–5.0)
HDL: 49 mg/dL (ref >39)
LDL CALC: 134 mg/dL — AB (ref 0–99)
TRIGLYCERIDES: 237 mg/dL — AB (ref 0–149)
VLDL Cholesterol Cal: 47 mg/dL — ABNORMAL HIGH (ref 5–40)

## 2017-09-20 LAB — CMP AND LIVER
ALBUMIN: 4.9 g/dL (ref 3.5–5.5)
ALT: 35 IU/L (ref 0–44)
AST: 28 IU/L (ref 0–40)
Alkaline Phosphatase: 73 IU/L (ref 39–117)
BUN: 15 mg/dL (ref 6–24)
Bilirubin Total: 0.5 mg/dL (ref 0.0–1.2)
Bilirubin, Direct: 0.13 mg/dL (ref 0.00–0.40)
CO2: 23 mmol/L (ref 20–29)
CREATININE: 0.98 mg/dL (ref 0.76–1.27)
Calcium: 10.2 mg/dL (ref 8.7–10.2)
Chloride: 101 mmol/L (ref 96–106)
GFR calc Af Amer: 101 mL/min/{1.73_m2} (ref >59)
GFR, EST NON AFRICAN AMERICAN: 87 mL/min/{1.73_m2} (ref >59)
GLUCOSE: 123 mg/dL — AB (ref 65–99)
Potassium: 4.1 mmol/L (ref 3.5–5.2)
Sodium: 141 mmol/L (ref 134–144)
Total Protein: 7.6 g/dL (ref 6.0–8.5)

## 2017-09-20 LAB — HEMOGLOBIN A1C
Est. average glucose Bld gHb Est-mCnc: 123 mg/dL
Hgb A1c MFr Bld: 5.9 % — ABNORMAL HIGH (ref 4.8–5.6)

## 2017-09-21 ENCOUNTER — Other Ambulatory Visit: Payer: Self-pay | Admitting: Physician Assistant

## 2017-09-21 MED ORDER — ROSUVASTATIN CALCIUM 10 MG PO TABS: 10.0000 mg | ORAL_TABLET | Freq: Every day | ORAL | 3 refills | Status: DC

## 2017-09-23 ENCOUNTER — Ambulatory Visit: Payer: 59 | Admitting: Emergency Medicine

## 2018-03-23 ENCOUNTER — Ambulatory Visit: Payer: 59 | Admitting: Physician Assistant

## 2018-04-08 ENCOUNTER — Ambulatory Visit: Payer: 59 | Admitting: Physician Assistant

## 2018-07-26 ENCOUNTER — Other Ambulatory Visit: Payer: Self-pay | Admitting: Physician Assistant

## 2018-07-26 DIAGNOSIS — I10 Essential (primary) hypertension: Secondary | ICD-10-CM

## 2018-09-08 ENCOUNTER — Other Ambulatory Visit: Payer: Self-pay | Admitting: Physician Assistant

## 2018-09-08 DIAGNOSIS — B009 Herpesviral infection, unspecified: Secondary | ICD-10-CM

## 2018-10-13 ENCOUNTER — Other Ambulatory Visit: Payer: Self-pay | Admitting: Physician Assistant

## 2019-12-31 ENCOUNTER — Ambulatory Visit: Payer: 59 | Admitting: Registered Nurse

## 2019-12-31 ENCOUNTER — Encounter: Payer: Self-pay | Admitting: Registered Nurse

## 2019-12-31 ENCOUNTER — Other Ambulatory Visit: Payer: Self-pay

## 2019-12-31 VITALS — BP 213/128 | HR 100 | Temp 97.7°F | Resp 18 | Ht 68.0 in | Wt 236.8 lb

## 2019-12-31 DIAGNOSIS — I10 Essential (primary) hypertension: Secondary | ICD-10-CM

## 2019-12-31 DIAGNOSIS — N529 Male erectile dysfunction, unspecified: Secondary | ICD-10-CM

## 2019-12-31 DIAGNOSIS — Z13228 Encounter for screening for other metabolic disorders: Secondary | ICD-10-CM

## 2019-12-31 DIAGNOSIS — Z1159 Encounter for screening for other viral diseases: Secondary | ICD-10-CM

## 2019-12-31 DIAGNOSIS — Z1329 Encounter for screening for other suspected endocrine disorder: Secondary | ICD-10-CM

## 2019-12-31 DIAGNOSIS — Z13220 Encounter for screening for lipoid disorders: Secondary | ICD-10-CM

## 2019-12-31 DIAGNOSIS — Z130 Encounter for screening for diseases of the blood and blood-forming organs and certain disorders involving the immune mechanism: Secondary | ICD-10-CM

## 2019-12-31 DIAGNOSIS — B009 Herpesviral infection, unspecified: Secondary | ICD-10-CM

## 2019-12-31 MED ORDER — ROSUVASTATIN CALCIUM 10 MG PO TABS: 10.0000 mg | ORAL_TABLET | Freq: Every day | ORAL | 3 refills | Status: DC

## 2019-12-31 MED ORDER — ACYCLOVIR 800 MG PO TABS: 800.0000 mg | ORAL_TABLET | Freq: Two times a day (BID) | ORAL | 3 refills | Status: DC

## 2019-12-31 MED ORDER — LISINOPRIL 20 MG PO TABS: 20.0000 mg | ORAL_TABLET | Freq: Every day | ORAL | 0 refills | Status: DC

## 2019-12-31 NOTE — Progress Notes (Signed)
Established Patient Office Visit  Subjective:  Patient ID: Blake Smith, male    DOB: May 11, 1961  Age: 59 y.o. MRN: [id number]  CC:  Chief Complaint  Patient presents with  . Hypertension    PAtient states he is starting to have more problems with his blood pressure for about one year and needs the medication back    HPI Blake Smith presents for HTN  Last on bp meds around 1 year ago - was on lisinopril 20mg  PO qd with good effect Noted that his home bp readings have been high Denies headaches, visual changes, chest pain, shob, doe, claudication, dependent edema, palpitations. Very active - works out nearly daily, runs 3-4 times each week. Reports average diet    Past Medical History:  Diagnosis Date  . Allergy   . Bone spur    dorssal distal  R foot  . Erectile dysfunction   . GERD (gastroesophageal reflux disease)   . Hypertension     Past Surgical History:  Procedure Laterality Date  . BICEPS TENDON REPAIR    . ROTATOR CUFF REPAIR     bilateral  . TONSILLECTOMY AND ADENOIDECTOMY    . undescended testicle     lesion penis removed  59 y/o    Family History  Problem Relation Age of Onset  . Hypertension Father   . Diabetes Father   . Stroke Father 61  . Colon cancer Neg Hx   . Colon polyps Neg Hx   . Esophageal cancer Neg Hx   . Rectal cancer Neg Hx   . Stomach cancer Neg Hx     Social History   Socioeconomic History  . Marital status: Married    Spouse name: Not on file  . Number of children: Not on file  . Years of education: Not on file  . Highest education level: Not on file  Occupational History  . Occupation: Chemical engineer: UNEMPLOYED  Tobacco Use  . Smoking status: Never Smoker  . Smokeless tobacco: Current User    Types: Snuff  Substance and Sexual Activity  . Alcohol use: Yes    Alcohol/week: 1.0 standard drinks    Types: 1 Standard drinks or equivalent per week    Comment: seldom - rare   . Drug use: No  . Sexual  activity: Yes  Other Topics Concern  . Not on file  Social History Narrative  . Not on file   Social Determinants of Health   Financial Resource Strain:   . Difficulty of Paying Living Expenses:   Food Insecurity:   . Worried About Charity fundraiser in the Last Year:   . Arboriculturist in the Last Year:   Transportation Needs:   . Film/video editor (Medical):   Marland Kitchen Lack of Transportation (Non-Medical):   Physical Activity:   . Days of Exercise per Week:   . Minutes of Exercise per Session:   Stress:   . Feeling of Stress :   Social Connections:   . Frequency of Communication with Friends and Family:   . Frequency of Social Gatherings with Friends and Family:   . Attends Religious Services:   . Active Member of Clubs or Organizations:   . Attends Archivist Meetings:   Marland Kitchen Marital Status:   Intimate Partner Violence:   . Fear of Current or Ex-Partner:   . Emotionally Abused:   Marland Kitchen Physically Abused:   . Sexually Abused:  Outpatient Medications Prior to Visit  Medication Sig Dispense Refill  . cetirizine (ZYRTEC) 10 MG tablet Take 10 mg by mouth daily.    . Garcinia Cambogia-Chromium 500-200 MG-MCG TABS Take 2 capsules by mouth daily.    Marland Kitchen OVER THE COUNTER MEDICATION 1 packet 2 (two) times daily before a meal. hydroxycut - 1 packet mixed in water 2 x a day    . ranitidine (ZANTAC) 150 MG tablet Take 150 mg by mouth 2 (two) times daily.    . sildenafil (VIAGRA) 100 MG tablet Take 0.5-1 tablets (50-100 mg total) by mouth daily as needed for erectile dysfunction. 20 tablet 11  . acyclovir (ZOVIRAX) 800 MG tablet Take 1 tablet (800 mg total) by mouth 2 (two) times daily. 20 tablet 3  . lisinopril (PRINIVIL,ZESTRIL) 20 MG tablet Take 1 tablet (20 mg total) by mouth daily. 90 tablet 3  . rosuvastatin (CRESTOR) 10 MG tablet Take 1 tablet (10 mg total) by mouth daily. 90 tablet 3   No facility-administered medications prior to visit.    No Known  Allergies  ROS Review of Systems  Constitutional: Negative.   HENT: Negative.   Eyes: Negative.   Respiratory: Negative.   Cardiovascular: Negative.   Gastrointestinal: Negative.   Endocrine: Negative.   Genitourinary: Negative.   Musculoskeletal: Negative.   Skin: Negative.   Allergic/Immunologic: Negative.   Neurological: Negative.   Hematological: Negative.   Psychiatric/Behavioral: Negative.   All other systems reviewed and are negative.     Objective:    Physical Exam  Constitutional: He is oriented to person, place, and time. He appears well-developed and well-nourished. No distress.  Cardiovascular: Normal rate, regular rhythm, normal heart sounds and intact distal pulses. Exam reveals no gallop and no friction rub.  No murmur heard. Pulmonary/Chest: Effort normal and breath sounds normal. No respiratory distress. He has no wheezes. He has no rales. He exhibits no tenderness.  Neurological: He is alert and oriented to person, place, and time.  Skin: Skin is warm and dry. No rash noted. He is not diaphoretic. No erythema. No pallor.  Psychiatric: He has a normal mood and affect. His behavior is normal. Judgment and thought content normal.  Nursing note and vitals reviewed.   BP (!) 213/128   Pulse 100   Temp 97.7 F (36.5 C) (Temporal)   Resp 18   Ht 5\' 8"  (1.727 m)   Wt 236 lb 12.8 oz (107.4 kg)   SpO2 99%   BMI 36.01 kg/m  Wt Readings from Last 3 Encounters:  12/31/19 236 lb 12.8 oz (107.4 kg)  09/19/17 238 lb (108 kg)  03/22/16 230 lb (104.3 kg)     Health Maintenance Due  Topic Date Due  . Hepatitis C Screening  Never done  . HIV Screening  Never done  . COVID-19 Vaccine (1) Never done    There are no preventive care reminders to display for this patient.  Lab Results  Component Value Date   TSH 4.424 08/18/2012   Lab Results  Component Value Date   WBC 7.5 09/19/2017   HGB 17.1 09/19/2017   HCT 47.8 09/19/2017   MCV 91 09/19/2017   PLT  244 09/19/2017   Lab Results  Component Value Date   NA 141 09/19/2017   K 4.1 09/19/2017   CO2 23 09/19/2017   GLUCOSE 123 (H) 09/19/2017   BUN 15 09/19/2017   CREATININE 0.98 09/19/2017   BILITOT 0.5 09/19/2017   ALKPHOS 73 09/19/2017   AST  28 09/19/2017   ALT 35 09/19/2017   PROT 7.6 09/19/2017   ALBUMIN 4.9 09/19/2017   CALCIUM 10.2 09/19/2017   Lab Results  Component Value Date   CHOL 230 (H) 09/19/2017   Lab Results  Component Value Date   HDL 49 09/19/2017   Lab Results  Component Value Date   LDLCALC 134 (H) 09/19/2017   Lab Results  Component Value Date   TRIG 237 (H) 09/19/2017   Lab Results  Component Value Date   CHOLHDL 4.7 09/19/2017   Lab Results  Component Value Date   HGBA1C 5.9 (H) 09/19/2017      Assessment & Plan:   Problem List Items Addressed This Visit      Other   Erectile dysfunction    Other Visit Diagnoses    Screening for endocrine, metabolic and immunity disorder    -  Primary   Relevant Orders   CBC with Differential   TSH   Comprehensive metabolic panel   Hemoglobin A1c   HSV (herpes simplex virus) infection       Relevant Medications   acyclovir (ZOVIRAX) 800 MG tablet   Hypertension       Relevant Medications   lisinopril (ZESTRIL) 20 MG tablet   rosuvastatin (CRESTOR) 10 MG tablet   Screening for viral disease       Relevant Orders   Hepatitis C antibody   HIV antibody (with reflex)   Lipid screening       Relevant Orders   Lipid panel      Meds ordered this encounter  Medications  . acyclovir (ZOVIRAX) 800 MG tablet    Sig: Take 1 tablet (800 mg total) by mouth 2 (two) times daily.    Dispense:  20 tablet    Refill:  3  . lisinopril (ZESTRIL) 20 MG tablet    Sig: Take 1 tablet (20 mg total) by mouth daily.    Dispense:  30 tablet    Refill:  0  . rosuvastatin (CRESTOR) 10 MG tablet    Sig: Take 1 tablet (10 mg total) by mouth daily.    Dispense:  90 tablet    Refill:  3    Follow-up: Return  in about 4 days (around 01/04/2020) for bp check - nurse visit.   PLAN  BP extremely elevated today  Restart lisinopril 20mg  PO qd  Return in 4 days for nurse visit bp check. At that time will likely double lisinopril and have another close follow up - will start a combination agent  Reviewed ER precautions  Labs collected, will follow up as warranted  Patient encouraged to call clinic with any questions, comments, or concerns.  Maximiano Coss, NP

## 2019-12-31 NOTE — Patient Instructions (Signed)
° ° ° °  If you have lab work done today you will be contacted with your lab results within the next 2 weeks.  If you have not heard from us then please contact us. The fastest way to get your results is to register for My Chart. ° ° °IF you received an x-ray today, you will receive an invoice from [facility]. Please contact  Radiology at [phone] with questions or concerns regarding your invoice.  ° °IF you received labwork today, you will receive an invoice from [facility]. Please contact [facility] at [phone] with questions or concerns regarding your invoice.  ° °Our billing staff will not be able to assist you with questions regarding bills from these companies. ° °You will be contacted with the lab results as soon as they are available. The fastest way to get your results is to activate your My Chart account. Instructions are located on the last page of this paperwork. If you have not heard from us regarding the results in 2 weeks, please contact this office. °  ° ° ° °

## 2020-01-01 LAB — CBC WITH DIFFERENTIAL/PLATELET
Basophils Absolute: 0.1 10*3/uL (ref 0.0–0.2)
Basos: 1 %
EOS (ABSOLUTE): 0.2 10*3/uL (ref 0.0–0.4)
Eos: 2 %
Hematocrit: 50.5 % (ref 37.5–51.0)
Hemoglobin: 17.4 g/dL (ref 13.0–17.7)
Immature Grans (Abs): 0 10*3/uL (ref 0.0–0.1)
Immature Granulocytes: 1 %
Lymphocytes Absolute: 1.2 10*3/uL (ref 0.7–3.1)
Lymphs: 14 %
MCH: 32 pg (ref 26.6–33.0)
MCHC: 34.5 g/dL (ref 31.5–35.7)
MCV: 93 fL (ref 79–97)
Monocytes Absolute: 0.6 10*3/uL (ref 0.1–0.9)
Monocytes: 7 %
Neutrophils Absolute: 6.1 10*3/uL (ref 1.4–7.0)
Neutrophils: 75 %
Platelets: 241 10*3/uL (ref 150–450)
RBC: 5.43 x10E6/uL (ref 4.14–5.80)
RDW: 12 % (ref 11.6–15.4)
WBC: 8.1 10*3/uL (ref 3.4–10.8)

## 2020-01-01 LAB — LIPID PANEL
Chol/HDL Ratio: 3.8 ratio (ref 0.0–5.0)
Cholesterol, Total: 248 mg/dL — ABNORMAL HIGH (ref 100–199)
HDL: 65 mg/dL (ref >39)
LDL Chol Calc (NIH): 151 mg/dL — ABNORMAL HIGH (ref 0–99)
Triglycerides: 180 mg/dL — ABNORMAL HIGH (ref 0–149)
VLDL Cholesterol Cal: 32 mg/dL (ref 5–40)

## 2020-01-01 LAB — COMPREHENSIVE METABOLIC PANEL
ALT: 35 IU/L (ref 0–44)
AST: 36 IU/L (ref 0–40)
Albumin/Globulin Ratio: 1.9 (ref 1.2–2.2)
Albumin: 4.8 g/dL (ref 3.8–4.9)
Alkaline Phosphatase: 94 IU/L (ref 39–117)
BUN/Creatinine Ratio: 14 (ref 9–20)
BUN: 11 mg/dL (ref 6–24)
Bilirubin Total: 0.7 mg/dL (ref 0.0–1.2)
CO2: 25 mmol/L (ref 20–29)
Calcium: 10.4 mg/dL — ABNORMAL HIGH (ref 8.7–10.2)
Chloride: 98 mmol/L (ref 96–106)
Creatinine, Ser: 0.8 mg/dL (ref 0.76–1.27)
GFR calc Af Amer: 115 mL/min/{1.73_m2} (ref >59)
GFR calc non Af Amer: 100 mL/min/{1.73_m2} (ref >59)
Globulin, Total: 2.5 g/dL (ref 1.5–4.5)
Glucose: 177 mg/dL — ABNORMAL HIGH (ref 65–99)
Potassium: 4.1 mmol/L (ref 3.5–5.2)
Sodium: 141 mmol/L (ref 134–144)
Total Protein: 7.3 g/dL (ref 6.0–8.5)

## 2020-01-01 LAB — HEMOGLOBIN A1C
Est. average glucose Bld gHb Est-mCnc: 212 mg/dL
Hgb A1c MFr Bld: 9 % — ABNORMAL HIGH (ref 4.8–5.6)

## 2020-01-01 LAB — TSH: TSH: 2.12 u[IU]/mL (ref 0.450–4.500)

## 2020-01-01 LAB — HEPATITIS C ANTIBODY: Hep C Virus Ab: <0.1 s/co ratio (ref 0.0–0.9)

## 2020-01-01 LAB — HIV ANTIBODY (ROUTINE TESTING W REFLEX): HIV Screen 4th Generation wRfx: NONREACTIVE

## 2020-01-04 ENCOUNTER — Encounter: Payer: Self-pay | Admitting: Registered Nurse

## 2020-01-04 ENCOUNTER — Ambulatory Visit: Payer: 59 | Admitting: Registered Nurse

## 2020-01-04 ENCOUNTER — Other Ambulatory Visit: Payer: Self-pay

## 2020-01-04 VITALS — BP 140/84

## 2020-01-04 DIAGNOSIS — I10 Essential (primary) hypertension: Secondary | ICD-10-CM

## 2020-01-04 NOTE — Telephone Encounter (Signed)
Pt requesting viagra last filled 09/19/2017

## 2020-01-05 ENCOUNTER — Other Ambulatory Visit: Payer: Self-pay | Admitting: Registered Nurse

## 2020-01-05 DIAGNOSIS — N529 Male erectile dysfunction, unspecified: Secondary | ICD-10-CM

## 2020-01-05 MED ORDER — SILDENAFIL CITRATE 100 MG PO TABS: 50.0000 mg | ORAL_TABLET | Freq: Every day | ORAL | 11 refills | Status: DC | PRN

## 2020-01-05 NOTE — Telephone Encounter (Signed)
Attempted to call and schedule a 3 month follow up. LVM for patient to return call.

## 2020-01-12 ENCOUNTER — Other Ambulatory Visit: Payer: Self-pay | Admitting: Registered Nurse

## 2020-01-12 DIAGNOSIS — E119 Type 2 diabetes mellitus without complications: Secondary | ICD-10-CM

## 2020-01-12 MED ORDER — METFORMIN HCL 1000 MG PO TABS: 1000.0000 mg | ORAL_TABLET | Freq: Two times a day (BID) | ORAL | 3 refills | Status: DC

## 2020-01-12 NOTE — Progress Notes (Signed)
Pt dx with t2dm with a1c of 9.0 Called pt Will start metformin 1000mg  PO bid Modify diet and exercise Return in 3 mo for A1c check Pt demonstrates understanding  Kathrin Ruddy, NP

## 2020-01-13 NOTE — Progress Notes (Signed)
Lvmtcb and schedule 3 month appt with Orland Mustard, NP

## 2020-01-25 ENCOUNTER — Other Ambulatory Visit: Payer: Self-pay | Admitting: Registered Nurse

## 2020-01-25 DIAGNOSIS — I10 Essential (primary) hypertension: Secondary | ICD-10-CM

## 2020-02-15 ENCOUNTER — Other Ambulatory Visit: Payer: Self-pay | Admitting: Registered Nurse

## 2020-02-15 DIAGNOSIS — I10 Essential (primary) hypertension: Secondary | ICD-10-CM

## 2020-02-15 MED ORDER — LISINOPRIL 20 MG PO TABS: 20.0000 mg | ORAL_TABLET | Freq: Once | ORAL | 0 refills | Status: DC

## 2020-02-22 ENCOUNTER — Other Ambulatory Visit: Payer: Self-pay

## 2020-02-22 DIAGNOSIS — I10 Essential (primary) hypertension: Secondary | ICD-10-CM

## 2020-02-22 MED ORDER — LISINOPRIL 20 MG PO TABS: 20.0000 mg | ORAL_TABLET | Freq: Once | ORAL | 1 refills | Status: DC

## 2020-03-22 ENCOUNTER — Other Ambulatory Visit: Payer: Self-pay

## 2020-03-22 ENCOUNTER — Encounter: Payer: Self-pay | Admitting: Registered Nurse

## 2020-03-22 ENCOUNTER — Ambulatory Visit: Payer: 59 | Admitting: Registered Nurse

## 2020-03-22 VITALS — BP 183/121 | HR 97 | Temp 97.8°F | Resp 18 | Ht 68.0 in | Wt 230.0 lb

## 2020-03-22 DIAGNOSIS — I10 Essential (primary) hypertension: Secondary | ICD-10-CM | POA: Diagnosis not present

## 2020-03-22 DIAGNOSIS — E119 Type 2 diabetes mellitus without complications: Secondary | ICD-10-CM

## 2020-03-22 DIAGNOSIS — E1159 Type 2 diabetes mellitus with other circulatory complications: Secondary | ICD-10-CM

## 2020-03-22 HISTORY — DX: Type 2 diabetes mellitus without complications: E11.9

## 2020-03-22 HISTORY — DX: Type 2 diabetes mellitus with other circulatory complications: E11.59

## 2020-03-22 LAB — POCT GLYCOSYLATED HEMOGLOBIN (HGB A1C): Hemoglobin A1C: 6.6 % — AB (ref 4.0–5.6)

## 2020-03-22 LAB — GLUCOSE, POCT (MANUAL RESULT ENTRY): POC Glucose: 173 mg/dl — AB (ref 70–99)

## 2020-03-22 NOTE — Patient Instructions (Signed)
° ° ° °  If you have lab work done today you will be contacted with your lab results within the next 2 weeks.  If you have not heard from us then please contact us. The fastest way to get your results is to register for My Chart. ° ° °IF you received an x-ray today, you will receive an invoice from [facility]. Please contact [facility] at [phone] with questions or concerns regarding your invoice.  ° °IF you received labwork today, you will receive an invoice from [facility]. Please contact [facility] at [phone] with questions or concerns regarding your invoice.  ° °Our billing staff will not be able to assist you with questions regarding bills from these companies. ° °You will be contacted with the lab results as soon as they are available. The fastest way to get your results is to activate your My Chart account. Instructions are located on the last page of this paperwork. If you have not heard from us regarding the results in 2 weeks, please contact this office. °  ° ° ° °

## 2020-04-03 ENCOUNTER — Encounter: Payer: Self-pay | Admitting: Registered Nurse

## 2020-04-05 ENCOUNTER — Encounter: Payer: Self-pay | Admitting: Registered Nurse

## 2020-04-07 ENCOUNTER — Encounter: Payer: Self-pay | Admitting: Registered Nurse

## 2020-04-10 ENCOUNTER — Other Ambulatory Visit: Payer: Self-pay | Admitting: Registered Nurse

## 2020-04-10 DIAGNOSIS — I10 Essential (primary) hypertension: Secondary | ICD-10-CM

## 2020-04-10 MED ORDER — LISINOPRIL 40 MG PO TABS: 40.0000 mg | ORAL_TABLET | Freq: Every day | ORAL | 3 refills | Status: DC

## 2020-04-10 NOTE — Telephone Encounter (Signed)
Medication was sent over to the pharmacy today.

## 2020-04-24 ENCOUNTER — Other Ambulatory Visit: Payer: Self-pay | Admitting: Registered Nurse

## 2020-04-24 DIAGNOSIS — B009 Herpesviral infection, unspecified: Secondary | ICD-10-CM

## 2020-06-06 ENCOUNTER — Other Ambulatory Visit: Payer: Self-pay | Admitting: Registered Nurse

## 2020-06-06 DIAGNOSIS — B009 Herpesviral infection, unspecified: Secondary | ICD-10-CM

## 2020-06-06 NOTE — Progress Notes (Signed)
Established Patient Office Visit  Subjective:  Patient ID: Blake Smith, male    DOB: 08-Aug-1961  Age: 59 y.o. MRN: [id number]  CC:  Chief Complaint  Patient presents with  . Follow-up    Patient is here for a follow up fot HTN and diabetes. Per patient he would like medication to be a 3 month supply and discuss metformin    HPI [name] presents for follow up on htn and t2dm  Admits that he has run out of meds Lifestyle control has been on and off No new cv symptoms  No new symptoms assoc with t2dm or hyperglycemia  Feeling well overall.   Past Medical History:  Diagnosis Date  . Allergy   . Bone spur    dorssal distal  R foot  . Erectile dysfunction   . GERD (gastroesophageal reflux disease)   . Hypertension     Past Surgical History:  Procedure Laterality Date  . BICEPS TENDON REPAIR    . ROTATOR CUFF REPAIR     bilateral  . TONSILLECTOMY AND ADENOIDECTOMY    . undescended testicle     lesion penis removed  59 y/o    Family History  Problem Relation Age of Onset  . Hypertension Father   . Diabetes Father   . Stroke Father 71  . Colon cancer Neg Hx   . Colon polyps Neg Hx   . Esophageal cancer Neg Hx   . Rectal cancer Neg Hx   . Stomach cancer Neg Hx     Social History   Socioeconomic History  . Marital status: Married    Spouse name: Not on file  . Number of children: Not on file  . Years of education: Not on file  . Highest education level: Not on file  Occupational History  . Occupation: Chemical engineer: UNEMPLOYED  Tobacco Use  . Smoking status: Never Smoker  . Smokeless tobacco: Current User    Types: Snuff  Vaping Use  . Vaping Use: Never used  Substance and Sexual Activity  . Alcohol use: Yes    Alcohol/week: 1.0 standard drink    Types: 1 Standard drinks or equivalent per week    Comment: seldom - rare   . Drug use: No  . Sexual activity: Yes  Other Topics Concern  . Not on file  Social History Narrative  .  Not on file   Social Determinants of Health   Financial Resource Strain:   . Difficulty of Paying Living Expenses: Not on file  Food Insecurity:   . Worried About Charity fundraiser in the Last Year: Not on file  . Ran Out of Food in the Last Year: Not on file  Transportation Needs:   . Lack of Transportation (Medical): Not on file  . Lack of Transportation (Non-Medical): Not on file  Physical Activity:   . Days of Exercise per Week: Not on file  . Minutes of Exercise per Session: Not on file  Stress:   . Feeling of Stress : Not on file  Social Connections:   . Frequency of Communication with Friends and Family: Not on file  . Frequency of Social Gatherings with Friends and Family: Not on file  . Attends Religious Services: Not on file  . Active Member of Clubs or Organizations: Not on file  . Attends Archivist Meetings: Not on file  . Marital Status: Not on file  Intimate Partner Violence:   . Fear  of Current or Ex-Partner: Not on file  . Emotionally Abused: Not on file  . Physically Abused: Not on file  . Sexually Abused: Not on file    Outpatient Medications Prior to Visit  Medication Sig Dispense Refill  . cetirizine (ZYRTEC) 10 MG tablet Take 10 mg by mouth daily.    . Garcinia Cambogia-Chromium 500-200 MG-MCG TABS Take 2 capsules by mouth daily.    . metFORMIN (GLUCOPHAGE) 1000 MG tablet Take 1 tablet (1,000 mg total) by mouth 2 (two) times daily with a meal. 180 tablet 3  . OVER THE COUNTER MEDICATION 1 packet 2 (two) times daily before a meal. hydroxycut - 1 packet mixed in water 2 x a day    . ranitidine (ZANTAC) 150 MG tablet Take 150 mg by mouth 2 (two) times daily.    . rosuvastatin (CRESTOR) 10 MG tablet Take 1 tablet (10 mg total) by mouth daily. 90 tablet 3  . sildenafil (VIAGRA) 100 MG tablet Take 0.5-1 tablets (50-100 mg total) by mouth daily as needed for erectile dysfunction. 20 tablet 11  . acyclovir (ZOVIRAX) 800 MG tablet Take 1 tablet (800 mg  total) by mouth 2 (two) times daily. 20 tablet 3  . lisinopril (ZESTRIL) 20 MG tablet Take 1 tablet (20 mg total) by mouth once for 1 dose. 90 tablet 1   No facility-administered medications prior to visit.    No Known Allergies  ROS Review of Systems  Constitutional: Negative.   HENT: Negative.   Eyes: Negative.   Respiratory: Negative.   Cardiovascular: Negative.   Gastrointestinal: Negative.   Genitourinary: Negative.   Musculoskeletal: Negative.   Skin: Negative.   Neurological: Negative.   Psychiatric/Behavioral: Negative.       Objective:    Physical Exam Constitutional:      General: He is not in acute distress.    Appearance: Normal appearance. He is normal weight. He is not ill-appearing, toxic-appearing or diaphoretic.  Cardiovascular:     Rate and Rhythm: Normal rate and regular rhythm.     Heart sounds: Normal heart sounds. No murmur heard.  No friction rub. No gallop.   Pulmonary:     Effort: Pulmonary effort is normal. No respiratory distress.     Breath sounds: Normal breath sounds. No stridor. No wheezing, rhonchi or rales.  Chest:     Chest wall: No tenderness.  Neurological:     General: No focal deficit present.     Mental Status: He is alert and oriented to person, place, and time. Mental status is at baseline.  Psychiatric:        Mood and Affect: Mood normal.        Behavior: Behavior normal.        Thought Content: Thought content normal.        Judgment: Judgment normal.     BP (!) 183/121   Pulse 97   Temp 97.8 F (36.6 C) (Temporal)   Resp 18   Ht 5\' 8"  (1.727 m)   Wt 230 lb (104.3 kg)   SpO2 100%   BMI 34.97 kg/m  Wt Readings from Last 3 Encounters:  03/22/20 230 lb (104.3 kg)  12/31/19 236 lb 12.8 oz (107.4 kg)  09/19/17 238 lb (108 kg)     Health Maintenance Due  Topic Date Due  . PNEUMOCOCCAL POLYSACCHARIDE VACCINE AGE 95-64 HIGH RISK  Never done  . FOOT EXAM  Never done  . OPHTHALMOLOGY EXAM  Never done  . COVID-19  Vaccine (  1) Never done  . INFLUENZA VACCINE  04/09/2020    There are no preventive care reminders to display for this patient.  Lab Results  Component Value Date   TSH 2.120 12/31/2019   Lab Results  Component Value Date   WBC 8.1 12/31/2019   HGB 17.4 12/31/2019   HCT 50.5 12/31/2019   MCV 93 12/31/2019   PLT 241 12/31/2019   Lab Results  Component Value Date   NA 141 12/31/2019   K 4.1 12/31/2019   CO2 25 12/31/2019   GLUCOSE 177 (H) 12/31/2019   BUN 11 12/31/2019   CREATININE 0.80 12/31/2019   BILITOT 0.7 12/31/2019   ALKPHOS 94 12/31/2019   AST 36 12/31/2019   ALT 35 12/31/2019   PROT 7.3 12/31/2019   ALBUMIN 4.8 12/31/2019   CALCIUM 10.4 (H) 12/31/2019   Lab Results  Component Value Date   CHOL 248 (H) 12/31/2019   Lab Results  Component Value Date   HDL 65 12/31/2019   Lab Results  Component Value Date   LDLCALC 151 (H) 12/31/2019   Lab Results  Component Value Date   TRIG 180 (H) 12/31/2019   Lab Results  Component Value Date   CHOLHDL 3.8 12/31/2019   Lab Results  Component Value Date   HGBA1C 6.6 (A) 03/22/2020      Assessment & Plan:   Problem List Items Addressed This Visit      Cardiovascular and Mediastinum   HTN (hypertension)     Endocrine   Type 2 diabetes mellitus without complication, without long-term current use of insulin (HCC) - Primary   Relevant Orders   POCT glycosylated hemoglobin (Hb A1C) (Completed)   POCT glucose (manual entry) (Completed)   Microalbumin, urine      No orders of the defined types were placed in this encounter.   Follow-up: No follow-ups on file.   PLAN  a1c down to 6.6 - great improvement  bp still elevated. Will adjust meds and check at nurse visit in 2-3 weeks  Return in 3 mo if bp wnl at that time  Patient encouraged to call clinic with any questions, comments, or concerns.  Maximiano Coss, NP

## 2020-06-06 NOTE — Telephone Encounter (Signed)
Requested Prescriptions  Pending Prescriptions Disp Refills  . acyclovir (ZOVIRAX) 800 MG tablet [Pharmacy Med Name: ACYCLOVIR 800MG  TABLETS] 20 tablet 3    Sig: TAKE 1 TABLET(800 MG) BY MOUTH TWICE DAILY     Antimicrobials:  Antiviral Agents - Anti-Herpetic Passed - 06/06/2020  3:10 AM      Passed - Valid encounter within last 12 months    Recent Outpatient Visits          2 months ago Type 2 diabetes mellitus without complication, without long-term current use of insulin ([place])   [facility] [place], NP   5 months ago Screening for endocrine, metabolic and immunity disorder   [facility], Delfino Lovett, NP   2 years ago Hypertension   [facility], Audrie Lia, PA-C   4 years ago Essential hypertension   [facility], MD   6 years ago Erectile dysfunction   [facility], Benn Moulder, MD

## 2020-12-04 ENCOUNTER — Other Ambulatory Visit: Payer: Self-pay | Admitting: Registered Nurse

## 2021-01-03 ENCOUNTER — Other Ambulatory Visit: Payer: Self-pay | Admitting: Registered Nurse

## 2021-01-03 DIAGNOSIS — E119 Type 2 diabetes mellitus without complications: Secondary | ICD-10-CM

## 2021-04-09 ENCOUNTER — Encounter: Payer: Self-pay | Admitting: Gastroenterology

## 2021-10-03 ENCOUNTER — Encounter: Payer: Self-pay | Admitting: Registered Nurse

## 2021-10-03 ENCOUNTER — Ambulatory Visit: Payer: 59 | Admitting: Registered Nurse

## 2021-10-03 VITALS — BP 165/107 | HR 73 | Temp 98.3°F | Resp 18 | Ht 68.0 in | Wt 240.4 lb

## 2021-10-03 DIAGNOSIS — K429 Umbilical hernia without obstruction or gangrene: Secondary | ICD-10-CM

## 2021-10-03 DIAGNOSIS — K439 Ventral hernia without obstruction or gangrene: Secondary | ICD-10-CM

## 2021-10-03 DIAGNOSIS — L918 Other hypertrophic disorders of the skin: Secondary | ICD-10-CM | POA: Diagnosis not present

## 2021-10-03 DIAGNOSIS — I10 Essential (primary) hypertension: Secondary | ICD-10-CM | POA: Diagnosis not present

## 2021-10-03 DIAGNOSIS — E119 Type 2 diabetes mellitus without complications: Secondary | ICD-10-CM

## 2021-10-03 MED ORDER — AMLODIPINE BESY-BENAZEPRIL HCL 10-40 MG PO CAPS: 1.0000 | ORAL_CAPSULE | Freq: Every day | ORAL | 0 refills | Status: DC

## 2021-10-03 MED ORDER — ROSUVASTATIN CALCIUM 10 MG PO TABS: 10.0000 mg | ORAL_TABLET | Freq: Every day | ORAL | 3 refills | Status: DC

## 2021-10-03 MED ORDER — METFORMIN HCL 1000 MG PO TABS: ORAL_TABLET | ORAL | 0 refills | Status: DC

## 2021-10-03 NOTE — Patient Instructions (Addendum)
Mr. Platz -   Doristine Devoid to see you  Start amlodipine-benazepril 10-40mg  daily. Let me know how it goes. Let's check on BP in 1-2 weeks. Check in with me in 3 mo  We'll recheck labs today. I'll let you know how they look.  I'll refer to general surgery. They'll call to set something up.  Call me with any concerns  Thank you  Rich     If you have lab work done today you will be contacted with your lab results within the next 2 weeks.  If you have not heard from [place] then please contact us. The fastest way to get your results is to register for My Chart.   IF you received an x-ray today, you will receive an invoice from [facility]. Please contact [facility] at [phone] with questions or concerns regarding your invoice.   IF you received labwork today, you will receive an invoice from [facility]. Please contact [facility] at [phone] with questions or concerns regarding your invoice.   Our billing staff will not be able to assist you with questions regarding bills from these companies.  You will be contacted with the lab results as soon as they are available. The fastest way to get your results is to activate your My Chart account. Instructions are located on the last page of this paperwork. If you have not heard from [place] regarding the results in 2 weeks, please contact this office.

## 2021-10-03 NOTE — Progress Notes (Signed)
Established Patient Office Visit  Subjective:  Patient ID: Blake Smith, male    DOB: July 17, 1961  Age: 61 y.o. MRN: [id number]  CC:  Chief Complaint  Patient presents with   Hernia    Patient states he is here to have his abdominal hernia looked at and some skin tags on neck.    HPI Blake Smith presents for hernia, skin tags  Hernia Umbilical and ventral. Sizeable ventral hernia No GI symptoms. Occasionally uncomfortable. He would like to have Gen Surg consult for this.  Skin Tags One on neck giving issues. Rubs against collar. Some in groin. Occasional irritation. Would like removal Did ok with liquid nitrogen in the past.  Hypertension: Patient Currently taking: no medications. Lisinopril in the past. Has run out.  No AEs. Denies CV symptoms including: chest pain, shob, doe, headache, visual changes, fatigue, claudication, and dependent edema.   Previous readings and labs: BP Readings from Last 3 Encounters:  10/03/21 (!) 165/107  03/22/20 (!) 183/121  01/04/20 140/84   Lab Results  Component Value Date   CREATININE 0.80 12/31/2019    T2dm Last A1c:  Lab Results  Component Value Date   HGBA1C 6.6 (A) 03/22/2020    Currently taking: metformin 1000mg  po bid ac - has run out No new complications Reports good compliance with medications Diet has been steady Exercise habits have been steady  Past Medical History:  Diagnosis Date   Allergy    Bone spur    dorssal distal  R foot   Erectile dysfunction    GERD (gastroesophageal reflux disease)    Hypertension     Past Surgical History:  Procedure Laterality Date   BICEPS TENDON REPAIR     ROTATOR CUFF REPAIR     bilateral   TONSILLECTOMY AND ADENOIDECTOMY     undescended testicle     lesion penis removed  61 y/o    Family History  Problem Relation Age of Onset   Hypertension Father    Diabetes Father    Stroke Father 66   Colon cancer Neg Hx    Colon polyps Neg Hx    Esophageal cancer  Neg Hx    Rectal cancer Neg Hx    Stomach cancer Neg Hx     Social History   Socioeconomic History   Marital status: Married    Spouse name: Not on file   Number of children: Not on file   Years of education: Not on file   Highest education level: Not on file  Occupational History   Occupation: Chemical engineer: UNEMPLOYED  Tobacco Use   Smoking status: Never   Smokeless tobacco: Current    Types: Snuff  Vaping Use   Vaping Use: Never used  Substance and Sexual Activity   Alcohol use: Yes    Alcohol/week: 1.0 standard drink    Types: 1 Standard drinks or equivalent per week    Comment: seldom - rare    Drug use: No   Sexual activity: Yes  Other Topics Concern   Not on file  Social History Narrative   Not on file   Social Determinants of Health   Financial Resource Strain: Not on file  Food Insecurity: Not on file  Transportation Needs: Not on file  Physical Activity: Not on file  Stress: Not on file  Social Connections: Not on file  Intimate Partner Violence: Not on file    Outpatient Medications Prior to Visit  Medication Sig Dispense Refill  acyclovir (ZOVIRAX) 800 MG tablet TAKE 1 TABLET(800 MG) BY MOUTH TWICE DAILY 20 tablet 3   cetirizine (ZYRTEC) 10 MG tablet Take 10 mg by mouth daily.     OVER THE COUNTER MEDICATION 1 packet 2 (two) times daily before a meal. hydroxycut - 1 packet mixed in water 2 x a day     ranitidine (ZANTAC) 150 MG tablet Take 150 mg by mouth 2 (two) times daily.     sildenafil (VIAGRA) 100 MG tablet Take 0.5-1 tablets (50-100 mg total) by mouth daily as needed for erectile dysfunction. 20 tablet 11   lisinopril (ZESTRIL) 40 MG tablet Take 1 tablet (40 mg total) by mouth daily. 90 tablet 3   metFORMIN (GLUCOPHAGE) 1000 MG tablet TAKE 1 TABLET(1000 MG) BY MOUTH TWICE DAILY WITH A MEAL 180 tablet 3   rosuvastatin (CRESTOR) 10 MG tablet Take 1 tablet (10 mg total) by mouth daily. 90 tablet 3   Garcinia Cambogia-Chromium 500-200  MG-MCG TABS Take 2 capsules by mouth daily.     No facility-administered medications prior to visit.    No Known Allergies  ROS Review of Systems  Constitutional: Negative.   HENT: Negative.    Eyes: Negative.   Respiratory: Negative.    Cardiovascular: Negative.   Gastrointestinal: Negative.   Genitourinary: Negative.   Musculoskeletal: Negative.   Skin: Negative.   Neurological: Negative.   Psychiatric/Behavioral: Negative.    All other systems reviewed and are negative.    Objective:    Physical Exam Constitutional:      General: He is not in acute distress.    Appearance: Normal appearance. He is normal weight. He is not ill-appearing, toxic-appearing or diaphoretic.  Cardiovascular:     Rate and Rhythm: Normal rate and regular rhythm.     Heart sounds: Normal heart sounds. No murmur heard.   No friction rub. No gallop.  Pulmonary:     Effort: Pulmonary effort is normal. No respiratory distress.     Breath sounds: Normal breath sounds. No stridor. No wheezing, rhonchi or rales.  Chest:     Chest wall: No tenderness.  Skin:    General: Skin is warm and dry.     Findings: Lesion (multiple benign appearing skin tags in groin. one on L anterior neck) present.  Neurological:     General: No focal deficit present.     Mental Status: He is alert and oriented to person, place, and time. Mental status is at baseline.  Psychiatric:        Mood and Affect: Mood normal.        Behavior: Behavior normal.        Thought Content: Thought content normal.        Judgment: Judgment normal.    BP (!) 165/107    Pulse 73    Temp 98.3 F (36.8 C) (Temporal)    Resp 18    Ht 5\' 8"  (1.727 m)    Wt 240 lb 6.4 oz (109 kg)    BMI 36.55 kg/m  Wt Readings from Last 3 Encounters:  10/03/21 240 lb 6.4 oz (109 kg)  03/22/20 230 lb (104.3 kg)  12/31/19 236 lb 12.8 oz (107.4 kg)     Health Maintenance Due  Topic Date Due   COVID-19 Vaccine (1) Never done   FOOT EXAM  Never done    OPHTHALMOLOGY EXAM  Never done   Zoster Vaccines- Shingrix (1 of 2) Never done   HEMOGLOBIN A1C  09/22/2020  There are no preventive care reminders to display for this patient.  Lab Results  Component Value Date   TSH 2.120 12/31/2019   Lab Results  Component Value Date   WBC 8.1 12/31/2019   HGB 17.4 12/31/2019   HCT 50.5 12/31/2019   MCV 93 12/31/2019   PLT 241 12/31/2019   Lab Results  Component Value Date   NA 141 12/31/2019   K 4.1 12/31/2019   CO2 25 12/31/2019   GLUCOSE 177 (H) 12/31/2019   BUN 11 12/31/2019   CREATININE 0.80 12/31/2019   BILITOT 0.7 12/31/2019   ALKPHOS 94 12/31/2019   AST 36 12/31/2019   ALT 35 12/31/2019   PROT 7.3 12/31/2019   ALBUMIN 4.8 12/31/2019   CALCIUM 10.4 (H) 12/31/2019   Lab Results  Component Value Date   CHOL 248 (H) 12/31/2019   Lab Results  Component Value Date   HDL 65 12/31/2019   Lab Results  Component Value Date   LDLCALC 151 (H) 12/31/2019   Lab Results  Component Value Date   TRIG 180 (H) 12/31/2019   Lab Results  Component Value Date   CHOLHDL 3.8 12/31/2019   Lab Results  Component Value Date   HGBA1C 6.6 (A) 03/22/2020      Assessment & Plan:   Problem List Items Addressed This Visit       Endocrine   Type 2 diabetes mellitus without complication, without long-term current use of insulin (HCC) - Primary   Relevant Medications   rosuvastatin (CRESTOR) 10 MG tablet   amLODipine-benazepril (LOTREL) 10-40 MG capsule   metFORMIN (GLUCOPHAGE) 1000 MG tablet   Other Relevant Orders   CBC with Differential/Platelet   Comprehensive metabolic panel   Hemoglobin A1c   Lipid panel   Other Visit Diagnoses     Essential hypertension       Relevant Medications   rosuvastatin (CRESTOR) 10 MG tablet   amLODipine-benazepril (LOTREL) 10-40 MG capsule   Other Relevant Orders   CBC with Differential/Platelet   Comprehensive metabolic panel   Hemoglobin A1c   Lipid panel   Skin tag       Ventral  hernia without obstruction or gangrene       Relevant Orders   Ambulatory referral to General Surgery   Newly diagnosed diabetes ([place])       Relevant Medications   rosuvastatin (CRESTOR) 10 MG tablet   amLODipine-benazepril (LOTREL) 10-40 MG capsule   metFORMIN (GLUCOPHAGE) 1000 MG tablet   Umbilical hernia without obstruction and without gangrene       Relevant Orders   Ambulatory referral to General Surgery       Meds ordered this encounter  Medications   rosuvastatin (CRESTOR) 10 MG tablet    Sig: Take 1 tablet (10 mg total) by mouth daily.    Dispense:  90 tablet    Refill:  3    Order Specific Question:   Supervising Provider    Answer:   Carlota Raspberry, JEFFREY R [[id number]]   amLODipine-benazepril (LOTREL) 10-40 MG capsule    Sig: Take 1 capsule by mouth daily.    Dispense:  90 capsule    Refill:  0    Order Specific Question:   Supervising Provider    Answer:   Carlota Raspberry, JEFFREY R [[id number]]   metFORMIN (GLUCOPHAGE) 1000 MG tablet    Sig: TAKE 1 TABLET(1000 MG) BY MOUTH TWICE DAILY WITH A MEAL    Dispense:  180 tablet    Refill:  0  Order Specific Question:   Supervising Provider    Answer:   Merri Ray R [[id number]]    Follow-up: Return in about 3 months (around 01/01/2022) for bp check in 1-2 weeks, med check in 3 mo with me.   PLAN Cryotherapy for skin tags. Pt tolerated well. Discussed risk and recurrence. Pt voices understanding Restart metformin and rosuvastatin. Switch antihypertensives to amlodipine benazepril. Bp check nurse visit in 1-2 weeks, visit with me in 3 mo Labs collected. Will follow up with the patient as warranted. Patient encouraged to call clinic with any questions, comments, or concerns.  Maximiano Coss, NP

## 2021-10-18 ENCOUNTER — Encounter: Payer: Self-pay | Admitting: Registered Nurse

## 2021-10-19 ENCOUNTER — Other Ambulatory Visit: Payer: Self-pay

## 2021-10-19 DIAGNOSIS — N529 Male erectile dysfunction, unspecified: Secondary | ICD-10-CM

## 2021-10-19 NOTE — Telephone Encounter (Signed)
Patient is requesting a refill of the following medications: Requested Prescriptions   Pending Prescriptions Disp Refills   sildenafil (VIAGRA) 100 MG tablet 20 tablet 11    Sig: Take 0.5-1 tablets (50-100 mg total) by mouth daily as needed for erectile dysfunction.    Date of patient request: 10/18/2021 Last office visit: 10/03/2021 Date of last refill: 12/16/2019 Last refill amount: 20 tablets 11 refills  Follow up time period per chart: 01/01/2022

## 2021-10-19 NOTE — Telephone Encounter (Signed)
Update on BP and medication refill request has been sent

## 2021-10-22 ENCOUNTER — Other Ambulatory Visit: Payer: Self-pay | Admitting: Registered Nurse

## 2021-10-22 DIAGNOSIS — N529 Male erectile dysfunction, unspecified: Secondary | ICD-10-CM

## 2021-10-22 MED ORDER — SILDENAFIL CITRATE 100 MG PO TABS: 50.0000 mg | ORAL_TABLET | Freq: Every day | ORAL | 11 refills | Status: DC | PRN

## 2021-10-22 NOTE — Telephone Encounter (Signed)
Sent ? ?Thanks, ? ?Rich

## 2021-12-26 ENCOUNTER — Other Ambulatory Visit: Payer: Self-pay | Admitting: Registered Nurse

## 2021-12-26 DIAGNOSIS — I10 Essential (primary) hypertension: Secondary | ICD-10-CM

## 2022-01-01 ENCOUNTER — Ambulatory Visit: Payer: 59 | Admitting: Registered Nurse

## 2022-01-21 ENCOUNTER — Other Ambulatory Visit: Payer: Self-pay | Admitting: Registered Nurse

## 2022-01-21 DIAGNOSIS — E119 Type 2 diabetes mellitus without complications: Secondary | ICD-10-CM

## 2022-05-01 ENCOUNTER — Other Ambulatory Visit: Payer: Self-pay

## 2022-05-01 DIAGNOSIS — E119 Type 2 diabetes mellitus without complications: Secondary | ICD-10-CM

## 2022-05-01 MED ORDER — METFORMIN HCL 1000 MG PO TABS: ORAL_TABLET | ORAL | 0 refills | Status: DC

## 2022-09-18 ENCOUNTER — Other Ambulatory Visit: Payer: Self-pay

## 2022-09-18 ENCOUNTER — Inpatient Hospital Stay (HOSPITAL_COMMUNITY)
Admission: EM | Admit: 2022-09-18 | Discharge: 2022-09-20 | DRG: 322 | Disposition: A | Discharge status: Home / Self Care | Payer: 59 | Attending: Cardiovascular Disease | Admitting: Cardiovascular Disease

## 2022-09-18 ENCOUNTER — Emergency Department (HOSPITAL_COMMUNITY): Payer: 59

## 2022-09-18 DIAGNOSIS — I214 Non-ST elevation (NSTEMI) myocardial infarction: Secondary | ICD-10-CM | POA: Diagnosis not present

## 2022-09-18 DIAGNOSIS — E1169 Type 2 diabetes mellitus with other specified complication: Secondary | ICD-10-CM | POA: Insufficient documentation

## 2022-09-18 DIAGNOSIS — Z833 Family history of diabetes mellitus: Secondary | ICD-10-CM

## 2022-09-18 DIAGNOSIS — Z6836 Body mass index (BMI) 36.0-36.9, adult: Secondary | ICD-10-CM

## 2022-09-18 DIAGNOSIS — Z79899 Other long term (current) drug therapy: Secondary | ICD-10-CM

## 2022-09-18 DIAGNOSIS — Z955 Presence of coronary angioplasty implant and graft: Secondary | ICD-10-CM

## 2022-09-18 DIAGNOSIS — E876 Hypokalemia: Secondary | ICD-10-CM | POA: Diagnosis present

## 2022-09-18 DIAGNOSIS — I2510 Atherosclerotic heart disease of native coronary artery without angina pectoris: Secondary | ICD-10-CM | POA: Insufficient documentation

## 2022-09-18 DIAGNOSIS — B009 Herpesviral infection, unspecified: Secondary | ICD-10-CM

## 2022-09-18 DIAGNOSIS — I10 Essential (primary) hypertension: Secondary | ICD-10-CM | POA: Diagnosis present

## 2022-09-18 DIAGNOSIS — I25110 Atherosclerotic heart disease of native coronary artery with unstable angina pectoris: Secondary | ICD-10-CM | POA: Diagnosis present

## 2022-09-18 DIAGNOSIS — E785 Hyperlipidemia, unspecified: Secondary | ICD-10-CM | POA: Diagnosis present

## 2022-09-18 DIAGNOSIS — E1165 Type 2 diabetes mellitus with hyperglycemia: Secondary | ICD-10-CM | POA: Diagnosis present

## 2022-09-18 DIAGNOSIS — Z8249 Family history of ischemic heart disease and other diseases of the circulatory system: Secondary | ICD-10-CM

## 2022-09-18 DIAGNOSIS — Z888 Allergy status to other drugs, medicaments and biological substances status: Secondary | ICD-10-CM

## 2022-09-18 DIAGNOSIS — E669 Obesity, unspecified: Secondary | ICD-10-CM | POA: Diagnosis present

## 2022-09-18 DIAGNOSIS — K219 Gastro-esophageal reflux disease without esophagitis: Secondary | ICD-10-CM | POA: Diagnosis present

## 2022-09-18 NOTE — ED Triage Notes (Signed)
Pt reports chest pressure and shortness of breath that started at approx noon today. Pt states that the pressure started in the center of his chest but has now moved to the left side of his chest. Hx of htn but has been off medication for 7 months.

## 2022-09-18 NOTE — ED Provider Triage Note (Signed)
Emergency Medicine Provider Triage Evaluation Note  Blake Smith , a 62 y.o. male  was evaluated in triage.  Pt complains of chest pain.  Started today around lunchtime, resolved but recurred again this evening after doing some yard work.  Does feel some shortness of breath.  No pain into the left shoulder or neck.  Denies any known cardiac history.  Not a smoker. No cough or other URI symptoms  Review of Systems  Positive: Chest pain Negative: fever  Physical Exam  BP (!) 179/115   Pulse 82   Temp 97.7 F (36.5 C)   Resp 16   SpO2 96%  Gen:   Awake, no distress   Resp:  Normal effort  MSK:   Moves extremities without difficulty  Other:    Medical Decision Making  Medically screening exam initiated at 11:03 PM.  Appropriate orders placed.  [name] was informed that the remainder of the evaluation will be completed by another provider, this initial triage assessment does not replace that evaluation, and the importance of remaining in the ED until their evaluation is complete.  Chest pain. No prior cardiac hx.  EKG, labs, CXR.  2:28 AM First trop 48, delta pending.  Patient updated, advised to wait for treatment room.  Updated charge RN.   Larene Pickett, PA-C 09/18/22 2310    Larene Pickett, PA-C 09/19/22 [phone]

## 2022-09-19 ENCOUNTER — Encounter (HOSPITAL_COMMUNITY)
Admission: EM | Disposition: A | Discharge status: Home / Self Care | Payer: Self-pay | Source: Home / Self Care | Attending: Internal Medicine

## 2022-09-19 ENCOUNTER — Inpatient Hospital Stay (HOSPITAL_COMMUNITY): Payer: 59

## 2022-09-19 ENCOUNTER — Other Ambulatory Visit (HOSPITAL_COMMUNITY): Payer: Self-pay

## 2022-09-19 DIAGNOSIS — I2510 Atherosclerotic heart disease of native coronary artery without angina pectoris: Secondary | ICD-10-CM

## 2022-09-19 DIAGNOSIS — I214 Non-ST elevation (NSTEMI) myocardial infarction: Secondary | ICD-10-CM

## 2022-09-19 DIAGNOSIS — I10 Essential (primary) hypertension: Secondary | ICD-10-CM

## 2022-09-19 HISTORY — PX: CORONARY STENT INTERVENTION: CATH118234

## 2022-09-19 HISTORY — PX: LEFT HEART CATH AND CORONARY ANGIOGRAPHY: CATH118249

## 2022-09-19 HISTORY — DX: Non-ST elevation (NSTEMI) myocardial infarction: I21.4

## 2022-09-19 LAB — CBC WITH DIFFERENTIAL/PLATELET
Abs Immature Granulocytes: 0.05 K/uL (ref 0.00–0.07)
Basophils Absolute: 0.1 K/uL (ref 0.0–0.1)
Basophils Relative: 2 %
Eosinophils Absolute: 0.2 K/uL (ref 0.0–0.5)
Eosinophils Relative: 2 %
HCT: 45.3 % (ref 39.0–52.0)
Hemoglobin: 16 g/dL (ref 13.0–17.0)
Immature Granulocytes: 1 %
Lymphocytes Relative: 18 %
Lymphs Abs: 1.3 K/uL (ref 0.7–4.0)
MCH: 32.5 pg (ref 26.0–34.0)
MCHC: 35.3 g/dL (ref 30.0–36.0)
MCV: 92.1 fL (ref 80.0–100.0)
Monocytes Absolute: 0.6 K/uL (ref 0.1–1.0)
Monocytes Relative: 8 %
Neutro Abs: 5 K/uL (ref 1.7–7.7)
Neutrophils Relative %: 69 %
Platelets: 233 K/uL (ref 150–400)
RBC: 4.92 MIL/uL (ref 4.22–5.81)
RDW: 11.6 % (ref 11.5–15.5)
WBC: 7.2 K/uL (ref 4.0–10.5)
nRBC: 0 % (ref 0.0–0.2)

## 2022-09-19 LAB — POCT ACTIVATED CLOTTING TIME
Activated Clotting Time: 266 seconds
Activated Clotting Time: 358 seconds

## 2022-09-19 LAB — ECHOCARDIOGRAM COMPLETE
Area-P 1/2: 3.08 cm2
Calc EF: 63.9 %
Height: 68 in
MV VTI: 2.91 cm2
S' Lateral: 3.1 cm
Single Plane A2C EF: 68.6 %
Single Plane A4C EF: 58.8 %
Weight: 3840 oz

## 2022-09-19 LAB — CBG MONITORING, ED
Glucose-Capillary: 298 mg/dL — ABNORMAL HIGH (ref 70–99)
Glucose-Capillary: 255 mg/dL — ABNORMAL HIGH (ref 70–99)
Glucose-Capillary: 210 mg/dL — ABNORMAL HIGH (ref 70–99)

## 2022-09-19 LAB — PROTIME-INR
INR: 0.9 (ref 0.8–1.2)
Prothrombin Time: 12.5 s (ref 11.4–15.2)

## 2022-09-19 LAB — BASIC METABOLIC PANEL
Anion gap: 11 (ref 5–15)
Anion gap: 10 (ref 5–15)
BUN: 9 mg/dL (ref 6–20)
BUN: 8 mg/dL (ref 6–20)
CO2: 23 mmol/L (ref 22–32)
CO2: 24 mmol/L (ref 22–32)
Calcium: 9.4 mg/dL (ref 8.9–10.3)
Calcium: 9.3 mg/dL (ref 8.9–10.3)
Chloride: 99 mmol/L (ref 98–111)
Chloride: 98 mmol/L (ref 98–111)
Creatinine, Ser: 0.83 mg/dL (ref 0.61–1.24)
Creatinine, Ser: 0.78 mg/dL (ref 0.61–1.24)
GFR, Estimated: >60 mL/min (ref >60)
GFR, Estimated: >60 mL/min (ref >60)
Glucose, Bld: 375 mg/dL — ABNORMAL HIGH (ref 70–99)
Glucose, Bld: 301 mg/dL — ABNORMAL HIGH (ref 70–99)
Potassium: 3.3 mmol/L — ABNORMAL LOW (ref 3.5–5.1)
Potassium: 3.4 mmol/L — ABNORMAL LOW (ref 3.5–5.1)
Sodium: 133 mmol/L — ABNORMAL LOW (ref 135–145)
Sodium: 132 mmol/L — ABNORMAL LOW (ref 135–145)

## 2022-09-19 LAB — HEMOGLOBIN A1C
Hgb A1c MFr Bld: 8.8 % — ABNORMAL HIGH (ref 4.8–5.6)
Mean Plasma Glucose: 205.86 mg/dL

## 2022-09-19 LAB — LIPID PANEL
Cholesterol: 235 mg/dL — ABNORMAL HIGH (ref 0–200)
HDL: 48 mg/dL (ref >40)
LDL Cholesterol: 136 mg/dL — ABNORMAL HIGH (ref 0–99)
Total CHOL/HDL Ratio: 4.9 RATIO
Triglycerides: 255 mg/dL — ABNORMAL HIGH (ref <150)
VLDL: 51 mg/dL — ABNORMAL HIGH (ref 0–40)

## 2022-09-19 LAB — TROPONIN I (HIGH SENSITIVITY)
Troponin I (High Sensitivity): 48 ng/L — ABNORMAL HIGH
Troponin I (High Sensitivity): 83 ng/L — ABNORMAL HIGH

## 2022-09-19 LAB — GLUCOSE, CAPILLARY: Glucose-Capillary: 193 mg/dL — ABNORMAL HIGH (ref 70–99)

## 2022-09-19 LAB — TSH: TSH: 4.114 u[IU]/mL (ref 0.350–4.500)

## 2022-09-19 LAB — MAGNESIUM: Magnesium: 1.9 mg/dL (ref 1.7–2.4)

## 2022-09-19 LAB — HEPARIN LEVEL (UNFRACTIONATED): Heparin Unfractionated: <0.1 IU/mL — ABNORMAL LOW (ref 0.30–0.70)

## 2022-09-19 SURGERY — LEFT HEART CATH AND CORONARY ANGIOGRAPHY
Anesthesia: LOCAL

## 2022-09-19 MED ORDER — LIDOCAINE HCL (PF) 1 % IJ SOLN
INTRAMUSCULAR | Status: AC
  Filled 2022-09-19: qty 30

## 2022-09-19 MED ORDER — ONDANSETRON HCL 4 MG/2ML IJ SOLN: 4.0000 mg | Freq: Four times a day (QID) | INTRAMUSCULAR | Status: DC | PRN

## 2022-09-19 MED ORDER — METOPROLOL TARTRATE 25 MG PO TABS: 12.5000 mg | ORAL_TABLET | Freq: Two times a day (BID) | ORAL | Status: DC

## 2022-09-19 MED ORDER — LIDOCAINE HCL (PF) 1 % IJ SOLN
INTRAMUSCULAR | Status: DC | PRN
  Administered 2022-09-19: 2 mL

## 2022-09-19 MED ORDER — TICAGRELOR 90 MG PO TABS
ORAL_TABLET | ORAL | Status: DC | PRN
  Administered 2022-09-19: 180 mg via ORAL

## 2022-09-19 MED ORDER — FENTANYL CITRATE PF 50 MCG/ML IJ SOSY: 25.0000 ug | PREFILLED_SYRINGE | Freq: Once | INTRAMUSCULAR | Status: DC

## 2022-09-19 MED ORDER — IOHEXOL 350 MG/ML SOLN
INTRAVENOUS | Status: DC | PRN
  Administered 2022-09-19: 150 mL

## 2022-09-19 MED ORDER — HYDRALAZINE HCL 20 MG/ML IJ SOLN
10.0000 mg | INTRAMUSCULAR | Status: AC | PRN
  Administered 2022-09-19: 10 mg via INTRAVENOUS

## 2022-09-19 MED ORDER — TICAGRELOR 90 MG PO TABS
ORAL_TABLET | ORAL | Status: AC
  Filled 2022-09-19: qty 2

## 2022-09-19 MED ORDER — SODIUM CHLORIDE 0.9 % WEIGHT BASED INFUSION: 1.0000 mL/kg/h | INTRAVENOUS | Status: DC

## 2022-09-19 MED ORDER — NITROGLYCERIN 0.4 MG SL SUBL: 0.4000 mg | SUBLINGUAL_TABLET | SUBLINGUAL | Status: DC | PRN

## 2022-09-19 MED ORDER — NITROGLYCERIN 1 MG/10 ML FOR IR/CATH LAB
INTRA_ARTERIAL | Status: AC
  Filled 2022-09-19: qty 10

## 2022-09-19 MED ORDER — HEPARIN SODIUM (PORCINE) 1000 UNIT/ML IJ SOLN
INTRAMUSCULAR | Status: AC
  Filled 2022-09-19: qty 10

## 2022-09-19 MED ORDER — HEPARIN (PORCINE) IN NACL 1000-0.9 UT/500ML-% IV SOLN
INTRAVENOUS | Status: DC | PRN
  Administered 2022-09-19 (×2): 500 mL

## 2022-09-19 MED ORDER — HEPARIN SODIUM (PORCINE) 1000 UNIT/ML IJ SOLN
INTRAMUSCULAR | Status: DC | PRN
  Administered 2022-09-19: 3000 [IU] via INTRAVENOUS
  Administered 2022-09-19 (×2): 5000 [IU] via INTRAVENOUS

## 2022-09-19 MED ORDER — PERFLUTREN LIPID MICROSPHERE
1.0000 mL | INTRAVENOUS | Status: AC | PRN
  Administered 2022-09-19: 2 mL via INTRAVENOUS

## 2022-09-19 MED ORDER — SODIUM CHLORIDE 0.9 % IV SOLN: INTRAVENOUS | Status: AC

## 2022-09-19 MED ORDER — HEPARIN BOLUS VIA INFUSION
4000.0000 [IU] | Freq: Once | INTRAVENOUS | Status: AC
  Administered 2022-09-19: 4000 [IU] via INTRAVENOUS
  Filled 2022-09-19: qty 4000

## 2022-09-19 MED ORDER — NITROGLYCERIN IN D5W 200-5 MCG/ML-% IV SOLN
0.0000 ug/min | INTRAVENOUS | Status: DC
  Administered 2022-09-19: 5 ug/min via INTRAVENOUS
  Filled 2022-09-19: qty 250

## 2022-09-19 MED ORDER — MIDAZOLAM HCL 2 MG/2ML IJ SOLN
INTRAMUSCULAR | Status: DC | PRN
  Administered 2022-09-19: 1 mg via INTRAVENOUS

## 2022-09-19 MED ORDER — SODIUM CHLORIDE 0.9% FLUSH
3.0000 mL | Freq: Two times a day (BID) | INTRAVENOUS | Status: DC
  Administered 2022-09-20: 3 mL via INTRAVENOUS

## 2022-09-19 MED ORDER — METOPROLOL TARTRATE 25 MG PO TABS
25.0000 mg | ORAL_TABLET | Freq: Two times a day (BID) | ORAL | Status: DC
  Administered 2022-09-19 – 2022-09-20 (×2): 25 mg via ORAL
  Filled 2022-09-19 (×2): qty 1

## 2022-09-19 MED ORDER — VERAPAMIL HCL 2.5 MG/ML IV SOLN
INTRAVENOUS | Status: AC
  Filled 2022-09-19: qty 2

## 2022-09-19 MED ORDER — FENTANYL CITRATE (PF) 100 MCG/2ML IJ SOLN
INTRAMUSCULAR | Status: DC | PRN
  Administered 2022-09-19: 25 ug via INTRAVENOUS

## 2022-09-19 MED ORDER — MIDAZOLAM HCL 2 MG/2ML IJ SOLN
INTRAMUSCULAR | Status: AC
  Filled 2022-09-19: qty 2

## 2022-09-19 MED ORDER — FENTANYL CITRATE (PF) 100 MCG/2ML IJ SOLN
INTRAMUSCULAR | Status: AC
  Filled 2022-09-19: qty 2

## 2022-09-19 MED ORDER — ASPIRIN 81 MG PO CHEW
81.0000 mg | CHEWABLE_TABLET | Freq: Every day | ORAL | Status: DC
  Administered 2022-09-20: 81 mg via ORAL
  Filled 2022-09-19: qty 1

## 2022-09-19 MED ORDER — TICAGRELOR 90 MG PO TABS
90.0000 mg | ORAL_TABLET | Freq: Two times a day (BID) | ORAL | Status: DC
  Administered 2022-09-19 – 2022-09-20 (×2): 90 mg via ORAL
  Filled 2022-09-19 (×2): qty 1

## 2022-09-19 MED ORDER — ACETAMINOPHEN 325 MG PO TABS: 650.0000 mg | ORAL_TABLET | ORAL | Status: DC | PRN

## 2022-09-19 MED ORDER — ASPIRIN 81 MG PO CHEW
324.0000 mg | CHEWABLE_TABLET | Freq: Once | ORAL | Status: AC
  Administered 2022-09-19: 324 mg via ORAL
  Filled 2022-09-19: qty 4

## 2022-09-19 MED ORDER — ASPIRIN 81 MG PO TBEC: 81.0000 mg | DELAYED_RELEASE_TABLET | Freq: Every day | ORAL | Status: DC

## 2022-09-19 MED ORDER — ATORVASTATIN CALCIUM 80 MG PO TABS
80.0000 mg | ORAL_TABLET | Freq: Every day | ORAL | Status: DC
  Administered 2022-09-20: 80 mg via ORAL
  Filled 2022-09-19: qty 1

## 2022-09-19 MED ORDER — ASPIRIN 81 MG PO CHEW: 81.0000 mg | CHEWABLE_TABLET | ORAL | Status: DC

## 2022-09-19 MED ORDER — SODIUM CHLORIDE 0.9 % IV SOLN: 250.0000 mL | INTRAVENOUS | Status: DC | PRN

## 2022-09-19 MED ORDER — INSULIN ASPART 100 UNIT/ML IJ SOLN: 0.0000 [IU] | Freq: Every day | INTRAMUSCULAR | Status: DC

## 2022-09-19 MED ORDER — SODIUM CHLORIDE 0.9% FLUSH: 3.0000 mL | INTRAVENOUS | Status: DC | PRN

## 2022-09-19 MED ORDER — INSULIN ASPART 100 UNIT/ML IJ SOLN
0.0000 [IU] | Freq: Three times a day (TID) | INTRAMUSCULAR | Status: DC
  Administered 2022-09-19: 5 [IU] via SUBCUTANEOUS
  Administered 2022-09-19: 8 [IU] via SUBCUTANEOUS
  Administered 2022-09-20: 3 [IU] via SUBCUTANEOUS
  Administered 2022-09-20: 8 [IU] via SUBCUTANEOUS

## 2022-09-19 MED ORDER — POTASSIUM CHLORIDE CRYS ER 20 MEQ PO TBCR
40.0000 meq | EXTENDED_RELEASE_TABLET | Freq: Once | ORAL | Status: AC
  Administered 2022-09-19: 40 meq via ORAL
  Filled 2022-09-19: qty 2

## 2022-09-19 MED ORDER — LABETALOL HCL 5 MG/ML IV SOLN: 10.0000 mg | INTRAVENOUS | Status: AC | PRN

## 2022-09-19 MED ORDER — VERAPAMIL HCL 2.5 MG/ML IV SOLN
INTRA_ARTERIAL | Status: DC | PRN
  Administered 2022-09-19: 10 mL via INTRA_ARTERIAL

## 2022-09-19 MED ORDER — MORPHINE SULFATE (PF) 2 MG/ML IV SOLN: 2.0000 mg | INTRAVENOUS | Status: DC | PRN

## 2022-09-19 MED ORDER — SODIUM CHLORIDE 0.9 % WEIGHT BASED INFUSION: 3.0000 mL/kg/h | INTRAVENOUS | Status: AC

## 2022-09-19 MED ORDER — HEPARIN (PORCINE) IN NACL 1000-0.9 UT/500ML-% IV SOLN
INTRAVENOUS | Status: AC
  Filled 2022-09-19: qty 1000

## 2022-09-19 MED ORDER — SODIUM CHLORIDE 0.9% FLUSH: 3.0000 mL | Freq: Two times a day (BID) | INTRAVENOUS | Status: DC

## 2022-09-19 MED ORDER — HEPARIN (PORCINE) 25000 UT/250ML-% IV SOLN
1300.0000 [IU]/h | INTRAVENOUS | Status: DC
  Administered 2022-09-19: 1300 [IU]/h via INTRAVENOUS
  Filled 2022-09-19: qty 250

## 2022-09-19 MED ORDER — HYDRALAZINE HCL 20 MG/ML IJ SOLN
INTRAMUSCULAR | Status: AC
  Filled 2022-09-19: qty 1

## 2022-09-19 SURGICAL SUPPLY — 19 items
BALLN SAPPHIRE 2.0X12 (BALLOONS) ×1
BALLOON SAPPHIRE 2.0X12 (BALLOONS) IMPLANT
CATH INFINITI 5 FR JL3.5 (CATHETERS) IMPLANT
CATH INFINITI JR4 5F (CATHETERS) IMPLANT
CATH OPTITORQUE TIG 4.0 5F (CATHETERS) IMPLANT
CATH OPTITORQUE TIG 4.0 6F (CATHETERS) IMPLANT
CATH VISTA GUIDE 6FR XBLAD3.0 (CATHETERS) IMPLANT
DEVICE RAD COMP TR BAND LRG (VASCULAR PRODUCTS) IMPLANT
GLIDESHEATH SLEND A-KIT 6F 22G (SHEATH) IMPLANT
GUIDEWIRE INQWIRE 1.5J.035X260 (WIRE) IMPLANT
INQWIRE 1.5J .035X260CM (WIRE) ×1
KIT ENCORE 26 ADVANTAGE (KITS) IMPLANT
KIT HEART LEFT (KITS) ×2 IMPLANT
PACK CARDIAC CATHETERIZATION (CUSTOM PROCEDURE TRAY) ×2 IMPLANT
STENT ONYX FRONTIER 3.5X18 (Permanent Stent) IMPLANT
TRANSDUCER W/STOPCOCK (MISCELLANEOUS) ×2 IMPLANT
TUBING CIL FLEX 10 FLL-RA (TUBING) ×2 IMPLANT
WIRE ASAHI PROWATER 180CM (WIRE) IMPLANT
WIRE HI TORQ VERSACORE-J 145CM (WIRE) IMPLANT

## 2022-09-19 NOTE — Progress Notes (Signed)
ANTICOAGULATION CONSULT NOTE - Initial Consult  Pharmacy Consult for Heparin Indication: chest pain/ACS  No Known Allergies  Patient Measurements: Height: '5\' 8"'$  (172.7 cm) Weight: 108.9 kg (240 lb) IBW/kg (Calculated) : 68.4 Heparin Dosing Weight: 90 kg  Vital Signs: Temp: 98 F (36.7 C) (01/11 0213) Temp Source: Oral (01/11 0213) BP: 179/116 (01/11 0213) Pulse Rate: 86 (01/11 0213)  Labs: Recent Labs    09/18/22 2311 09/19/22 0126  HGB 16.0  --   HCT 45.3  --   PLT 233  --   CREATININE 0.83  --   TROPONINIHS 48* 83*    Estimated Creatinine Clearance: 114.7 mL/min (by C-G formula based on SCr of 0.83 mg/dL).   Medical History: Past Medical History:  Diagnosis Date   Allergy    Bone spur    dorssal distal  R foot   Erectile dysfunction    GERD (gastroesophageal reflux disease)    Hypertension     Medications:  No current facility-administered medications on file prior to encounter.   Current Outpatient Medications on File Prior to Encounter  Medication Sig Dispense Refill   acyclovir (ZOVIRAX) 800 MG tablet TAKE 1 TABLET(800 MG) BY MOUTH TWICE DAILY 20 tablet 3   amLODipine-benazepril (LOTREL) 10-40 MG capsule TAKE 1 CAPSULE BY MOUTH DAILY 90 capsule 0   cetirizine (ZYRTEC) 10 MG tablet Take 10 mg by mouth daily.     metFORMIN (GLUCOPHAGE) 1000 MG tablet TAKE 1 TABLET(1000 MG) BY MOUTH TWICE DAILY WITH A MEAL 180 tablet 0   metFORMIN (GLUCOPHAGE) 1000 MG tablet TAKE 1 TABLET(1000 MG) BY MOUTH TWICE DAILY WITH A MEAL 180 tablet 0   OVER THE COUNTER MEDICATION 1 packet 2 (two) times daily before a meal. hydroxycut - 1 packet mixed in water 2 x a day     ranitidine (ZANTAC) 150 MG tablet Take 150 mg by mouth 2 (two) times daily.     rosuvastatin (CRESTOR) 10 MG tablet Take 1 tablet (10 mg total) by mouth daily. 90 tablet 3   sildenafil (VIAGRA) 100 MG tablet Take 0.5-1 tablets (50-100 mg total) by mouth daily as needed for erectile dysfunction. 30 tablet 11      Assessment: 62 y.o. male with chest pain for heparin  Goal of Therapy:  Heparin level 0.3-0.7 units/ml Monitor platelets by anticoagulation protocol: Yes   Plan:  Heparin 4000 units IV bolus, then start heparin 1300 units/hr Check heparin level in 6 hours.   Caryl Pina 09/19/2022,5:01 AM

## 2022-09-19 NOTE — Progress Notes (Signed)
TR BAND REMOVAL  LOCATION:    radial rt radial  DEFLATED PER PROTOCOL:   yes  TIME BAND OFF / DRESSING APPLIED:    1930/gauze and tegaderm  SITE UPON ARRIVAL:    Level 0  SITE AFTER BAND REMOVAL:    Level 0  CIRCULATION SENSATION AND MOVEMENT:    Within Normal Limits : palpable rt radial, hand and fingers warm and pink, good pleth waveform  COMMENTS:

## 2022-09-19 NOTE — Progress Notes (Addendum)
Rounding Note    Patient Name: Blake Smith Date of Encounter: 09/19/2022  [place] Cardiologist: None   Subjective   No chest pain this morning. Sitting up in the bed, comfortable.   Inpatient Medications    Scheduled Meds:  [START ON 09/20/2022] aspirin EC  81 mg Oral Daily   atorvastatin  80 mg Oral Daily   insulin aspart  0-15 Units Subcutaneous TID WC   insulin aspart  0-5 Units Subcutaneous QHS   metoprolol tartrate  12.5 mg Oral BID   Continuous Infusions:  heparin 1,300 Units/hr (09/19/22 0532)   nitroGLYCERIN 5 mcg/min (09/19/22 0517)   PRN Meds: acetaminophen, nitroGLYCERIN, ondansetron (ZOFRAN) IV   Vital Signs    Vitals:   09/18/22 2310 09/19/22 0213 09/19/22 0515 09/19/22 0700  BP:  (!) 179/116 (!) 198/114 (!) 167/98  Pulse:  86 76 78  Resp:  16 (!) 22 15  Temp:  98 F (36.7 C)    TempSrc:  Oral    SpO2:  96% 100% 97%  Weight: 108.9 kg     Height: '5\' 8"'$  (1.727 m)      No intake or output data in the 24 hours ending 09/19/22 0755    09/18/2022   11:10 PM 10/03/2021    3:23 PM 03/22/2020    1:51 PM  Last 3 Weights  Weight (lbs) 240 lb 240 lb 6.4 oz 230 lb  Weight (kg) 108.863 kg 109.045 kg 104.327 kg      Telemetry    Sinus Rhythm - Personally Reviewed  ECG    Sinus Rhythm 77 bpm, slight J point elevation inferior leads - Personally Reviewed  Physical Exam   GEN: No acute distress.   Neck: No JVD Cardiac: RRR, no murmurs, rubs, or gallops.  Respiratory: Clear to auscultation bilaterally. GI: Soft, nontender, non-distended  MS: No edema; No deformity. Neuro:  Nonfocal  Psych: Normal affect   Labs    High Sensitivity Troponin:   Recent Labs  Lab 09/18/22 2311 09/19/22 0126  TROPONINIHS 48* 83*     Chemistry Recent Labs  Lab 09/18/22 2311  NA 133*  K 3.3*  CL 99  CO2 23  GLUCOSE 375*  BUN 9  CREATININE 0.83  CALCIUM 9.4  GFRNONAA >60  ANIONGAP 11    Lipids No results for input(s): "CHOL", "TRIG",  "HDL", "LABVLDL", "LDLCALC", "CHOLHDL" in the last 168 hours.  Hematology Recent Labs  Lab 09/18/22 2311  WBC 7.2  RBC 4.92  HGB 16.0  HCT 45.3  MCV 92.1  MCH 32.5  MCHC 35.3  RDW 11.6  PLT 233   Thyroid No results for input(s): "TSH", "FREET4" in the last 168 hours.  BNPNo results for input(s): "BNP", "PROBNP" in the last 168 hours.  DDimer No results for input(s): "DDIMER" in the last 168 hours.   Radiology    DG Chest 2 View  Result Date: 09/18/2022 CLINICAL DATA:  Chest pain. EXAM: CHEST - 2 VIEW COMPARISON:  November 28, 2012 FINDINGS: The heart size and mediastinal contours are within normal limits. Low lung volumes are noted. Both lungs are clear. The visualized skeletal structures are unremarkable. IMPRESSION: No active cardiopulmonary disease. Electronically Signed   By: Virgina Norfolk M.D.   On: 09/18/2022 23:36    Cardiac Studies   N/a   Patient Profile     61 y.o. male with HTN, HLD, DM2 on metformin who was seen 09/19/2022 for the evaluation of chest pain.   Assessment &  Plan    NSTEMI -- hsTn 48>>83, presented with several episodes of centralized chest pain with radiation up into his neck with diaphoresis and dyspnea since Wednesday afternoon.  Symptoms concerning for ACS.  Need for cardiac catheterization today.  -- Continue IV heparin, nitro, aspirin, statin, beta-blocker -- Echo pending  Shared Decision Making/Informed Consent{ The risks [stroke (1 in 1000), death (1 in 1000), kidney failure [usually temporary] (1 in 500), bleeding (1 in 200), allergic reaction [possibly serious] (1 in 200)], benefits (diagnostic support and management of coronary artery disease) and alternatives of a cardiac catheterization were discussed in detail with Blake Smith and he is willing to proceed.  HTN -- Reports being on blood pressure medicine in the past but has essentially been off all medications for quite some time as he ordered no longer needing them -- increase  Metoprolol 25 mg twice daily, suspect may need additional titration.  Will defer till after cardiac catheterization, ideally add ARB with history of diabetes  HLD -- LDL 136, HDL 48 -- Started on atorvastatin 80 mg daily  DM -- Hgb A1c 8.8 -- has not been on meds for awhile -- add SSI -- Diabetes coordinator, likely add SGLT2 prior to discharge  Hypokalemia -- Supplement   For questions or updates, please contact [place] Please consult [email or web address] for contact info under        Signed, Reino Bellis, NP  09/19/2022, 7:55 AM    Patient seen, examined. Available data reviewed. Agree with findings, assessment, and plan as outlined by Reino Bellis, NP.  The patient is independently interviewed and examined.  His wife is at the bedside.  He is alert, oriented, in no distress.  HEENT is normal, lungs are clear bilaterally, JVP is normal, carotid upstrokes are normal without bruits, heart is regular rate and rhythm with no murmur gallop, extremities have no edema, skin is warm and dry.  The patient has typical symptoms of accelerating angina of new onset yesterday.  His troponin is mildly elevated.  His clinical presentation is consistent with non-ST elevation MI.  Cardiac catheterization and possible PCI is indicated. I have reviewed the risks, indications, and alternatives to cardiac catheterization, possible angioplasty, and stenting with the patient. Risks include but are not limited to bleeding, infection, vascular injury, stroke, myocardial infection, arrhythmia, kidney injury, radiation-related injury in the case of prolonged fluoroscopy use, emergency cardiac surgery, and death. The patient understands the risks of serious complication is 1-2 in 5701 with diagnostic cardiac cath and 1-2% or less with angioplasty/stenting.  The patient has multiple uncontrolled risk factors with hyperlipidemia and diabetes hemoglobin A1c 8.8.  Agree with documentation outlined above for  treatment of his other cardiovascular problems.  Further plans/disposition pending his cardiac catheterization results.  Sherren Mocha, M.D. 09/19/2022 10:00 AM

## 2022-09-19 NOTE — Progress Notes (Signed)
  Echocardiogram 2D Echocardiogram has been performed.  Blake Smith 09/19/2022, 1:00 PM

## 2022-09-19 NOTE — TOC Benefit Eligibility Note (Addendum)
Patient Teacher, English as a foreign language completed.    The patient is currently admitted and upon discharge could be taking Jardiance 10 mg.  The current 30 day co-pay is $0.00.   The patient is currently admitted and upon discharge could be taking Farxiga  10 mg.  Non Formulary  The patient is currently admitted and upon discharge could be taking Vascepa 1 g.  Non Formulary  The patient is currently admitted and upon discharge could be taking Brilinta 90 mg.  The current 30 day co-pay is $50.00.   The patient is insured through [employer], [place] Patient [place] Patient Advocate Team Direct Number: [phone]  Fax: [phone]

## 2022-09-19 NOTE — H&P (View-Only) (Signed)
Rounding Note    Patient Name: Blake Smith Date of Encounter: 09/19/2022  [place] Cardiologist: None   Subjective   No chest pain this morning. Sitting up in the bed, comfortable.   Inpatient Medications    Scheduled Meds:  [START ON 09/20/2022] aspirin EC  81 mg Oral Daily   atorvastatin  80 mg Oral Daily   insulin aspart  0-15 Units Subcutaneous TID WC   insulin aspart  0-5 Units Subcutaneous QHS   metoprolol tartrate  12.5 mg Oral BID   Continuous Infusions:  heparin 1,300 Units/hr (09/19/22 0532)   nitroGLYCERIN 5 mcg/min (09/19/22 0517)   PRN Meds: acetaminophen, nitroGLYCERIN, ondansetron (ZOFRAN) IV   Vital Signs    Vitals:   09/18/22 2310 09/19/22 0213 09/19/22 0515 09/19/22 0700  BP:  (!) 179/116 (!) 198/114 (!) 167/98  Pulse:  86 76 78  Resp:  16 (!) 22 15  Temp:  98 F (36.7 C)    TempSrc:  Oral    SpO2:  96% 100% 97%  Weight: 108.9 kg     Height: '5\' 8"'$  (1.727 m)      No intake or output data in the 24 hours ending 09/19/22 0755    09/18/2022   11:10 PM 10/03/2021    3:23 PM 03/22/2020    1:51 PM  Last 3 Weights  Weight (lbs) 240 lb 240 lb 6.4 oz 230 lb  Weight (kg) 108.863 kg 109.045 kg 104.327 kg      Telemetry    Sinus Rhythm - Personally Reviewed  ECG    Sinus Rhythm 77 bpm, slight J point elevation inferior leads - Personally Reviewed  Physical Exam   GEN: No acute distress.   Neck: No JVD Cardiac: RRR, no murmurs, rubs, or gallops.  Respiratory: Clear to auscultation bilaterally. GI: Soft, nontender, non-distended  MS: No edema; No deformity. Neuro:  Nonfocal  Psych: Normal affect   Labs    High Sensitivity Troponin:   Recent Labs  Lab 09/18/22 2311 09/19/22 0126  TROPONINIHS 48* 83*     Chemistry Recent Labs  Lab 09/18/22 2311  NA 133*  K 3.3*  CL 99  CO2 23  GLUCOSE 375*  BUN 9  CREATININE 0.83  CALCIUM 9.4  GFRNONAA >60  ANIONGAP 11    Lipids No results for input(s): "CHOL", "TRIG",  "HDL", "LABVLDL", "LDLCALC", "CHOLHDL" in the last 168 hours.  Hematology Recent Labs  Lab 09/18/22 2311  WBC 7.2  RBC 4.92  HGB 16.0  HCT 45.3  MCV 92.1  MCH 32.5  MCHC 35.3  RDW 11.6  PLT 233   Thyroid No results for input(s): "TSH", "FREET4" in the last 168 hours.  BNPNo results for input(s): "BNP", "PROBNP" in the last 168 hours.  DDimer No results for input(s): "DDIMER" in the last 168 hours.   Radiology    DG Chest 2 View  Result Date: 09/18/2022 CLINICAL DATA:  Chest pain. EXAM: CHEST - 2 VIEW COMPARISON:  November 28, 2012 FINDINGS: The heart size and mediastinal contours are within normal limits. Low lung volumes are noted. Both lungs are clear. The visualized skeletal structures are unremarkable. IMPRESSION: No active cardiopulmonary disease. Electronically Signed   By: Virgina Norfolk M.D.   On: 09/18/2022 23:36    Cardiac Studies   N/a   Patient Profile     62 y.o. male with HTN, HLD, DM2 on metformin who was seen 09/19/2022 for the evaluation of chest pain.   Assessment &  Plan    NSTEMI -- hsTn 48>>83, presented with several episodes of centralized chest pain with radiation up into his neck with diaphoresis and dyspnea since Wednesday afternoon.  Symptoms concerning for ACS.  Need for cardiac catheterization today.  -- Continue IV heparin, nitro, aspirin, statin, beta-blocker -- Echo pending  Shared Decision Making/Informed Consent{ The risks [stroke (1 in 1000), death (1 in 1000), kidney failure [usually temporary] (1 in 500), bleeding (1 in 200), allergic reaction [possibly serious] (1 in 200)], benefits (diagnostic support and management of coronary artery disease) and alternatives of a cardiac catheterization were discussed in detail with Blake Smith and he is willing to proceed.  HTN -- Reports being on blood pressure medicine in the past but has essentially been off all medications for quite some time as he ordered no longer needing them -- increase  Metoprolol 25 mg twice daily, suspect may need additional titration.  Will defer till after cardiac catheterization, ideally add ARB with history of diabetes  HLD -- LDL 136, HDL 48 -- Started on atorvastatin 80 mg daily  DM -- Hgb A1c 8.8 -- has not been on meds for awhile -- add SSI -- Diabetes coordinator, likely add SGLT2 prior to discharge  Hypokalemia -- Supplement   For questions or updates, please contact [place] Please consult [email or web address] for contact info under        Signed, Reino Bellis, NP  09/19/2022, 7:55 AM    Patient seen, examined. Available data reviewed. Agree with findings, assessment, and plan as outlined by Reino Bellis, NP.  The patient is independently interviewed and examined.  His wife is at the bedside.  He is alert, oriented, in no distress.  HEENT is normal, lungs are clear bilaterally, JVP is normal, carotid upstrokes are normal without bruits, heart is regular rate and rhythm with no murmur gallop, extremities have no edema, skin is warm and dry.  The patient has typical symptoms of accelerating angina of new onset yesterday.  His troponin is mildly elevated.  His clinical presentation is consistent with non-ST elevation MI.  Cardiac catheterization and possible PCI is indicated. I have reviewed the risks, indications, and alternatives to cardiac catheterization, possible angioplasty, and stenting with the patient. Risks include but are not limited to bleeding, infection, vascular injury, stroke, myocardial infection, arrhythmia, kidney injury, radiation-related injury in the case of prolonged fluoroscopy use, emergency cardiac surgery, and death. The patient understands the risks of serious complication is 1-2 in 9604 with diagnostic cardiac cath and 1-2% or less with angioplasty/stenting.  The patient has multiple uncontrolled risk factors with hyperlipidemia and diabetes hemoglobin A1c 8.8.  Agree with documentation outlined above for  treatment of his other cardiovascular problems.  Further plans/disposition pending his cardiac catheterization results.  Sherren Mocha, M.D. 09/19/2022 10:00 AM

## 2022-09-19 NOTE — Interval H&P Note (Signed)
Cath Lab Visit (complete for each Cath Lab visit)  Clinical Evaluation Leading to the Procedure:   ACS: Yes.    Non-ACS:    Anginal Classification: CCS II  Anti-ischemic medical therapy: No Therapy  Non-Invasive Test Results: No non-invasive testing performed  Prior CABG: No previous CABG      History and Physical Interval Note:  09/19/2022 3:04 PM  Blake Smith  has presented today for surgery, with the diagnosis of UNSTABLE ANGINA.  The various methods of treatment have been discussed with the patient and family. After consideration of risks, benefits and other options for treatment, the patient has consented to  Procedure(s): LEFT HEART CATH AND CORONARY ANGIOGRAPHY (N/A) as a surgical intervention.  The patient's history has been reviewed, patient examined, no change in status, stable for surgery.  I have reviewed the patient's chart and labs.  Questions were answered to the patient's satisfaction.     Quay Burow

## 2022-09-19 NOTE — ED Provider Notes (Signed)
[facility] Emergency Department Provider Note MRN:  [id number]  Arrival date & time: 09/19/22     Chief Complaint   Chest Pain   History of Present Illness   Blake Smith is a 62 y.o. year-old male presents to the ED with chief complaint of chest pain.  Onset yesterday.  First came on at rest, then resolved.  Then patient did yard work and pain has been persistent since then.  He states that the pain significantly worsens even when walking down the hall.  Risk factors: HTN, HL, DM, family hx.  History provided by patient.   Review of Systems  Pertinent positive and negative review of systems noted in HPI.    Physical Exam   Vitals:   09/19/22 0213 09/19/22 0515  BP: (!) 179/116 (!) 198/114  Pulse: 86 76  Resp: 16 (!) 22  Temp: 98 F (36.7 C)   SpO2: 96% 100%    CONSTITUTIONAL:  well-appearing, NAD NEURO:  Alert and oriented x 3, CN 3-12 grossly intact EYES:  eyes equal and reactive ENT/NECK:  Supple, no stridor  CARDIO:  normal rate, regular rhythm, appears well-perfused  PULM:  No respiratory distress, CTAB GI/GU:  non-distended,  MSK/SPINE:  No gross deformities, no edema, moves all extremities  SKIN:  no rash, atraumatic   *Additional and/or pertinent findings included in MDM below  Diagnostic and Interventional Summary    EKG Interpretation  Date/Time:  Thursday September 19 2022 05:02:10 EST Ventricular Rate:  77 PR Interval:  180 QRS Duration: 90 QT Interval:  371 QTC Calculation: 420 R Axis:   71 Text Interpretation: Sinus rhythm Minimal ST depression, lateral leads Confirmed by Randal Buba, April ([id number]) on 09/19/2022 5:04:04 AM       Labs Reviewed  BASIC METABOLIC PANEL - Abnormal; Notable for the following components:      Result Value   Sodium 133 (*)    Potassium 3.3 (*)    Glucose, Bld 375 (*)    All other components within normal limits  CBG MONITORING, ED - Abnormal; Notable for the following components:    Glucose-Capillary 298 (*)    All other components within normal limits  TROPONIN I (HIGH SENSITIVITY) - Abnormal; Notable for the following components:   Troponin I (High Sensitivity) 48 (*)    All other components within normal limits  TROPONIN I (HIGH SENSITIVITY) - Abnormal; Notable for the following components:   Troponin I (High Sensitivity) 83 (*)    All other components within normal limits  CBC WITH DIFFERENTIAL/PLATELET  HEPARIN LEVEL (UNFRACTIONATED)  PROTIME-INR  LIPID PANEL  TSH  HEMOGLOBIN A1C  HIV ANTIBODY (ROUTINE TESTING W REFLEX)    DG Chest 2 View  Final Result      Medications  nitroGLYCERIN 50 mg in dextrose 5 % 250 mL (0.2 mg/mL) infusion (5 mcg/min Intravenous New Bag/Given 09/19/22 0517)  heparin ADULT infusion 100 units/mL (25000 units/275m) (1,300 Units/hr Intravenous New Bag/Given 09/19/22 0532)  aspirin EC tablet 81 mg (has no administration in time range)  nitroGLYCERIN (NITROSTAT) SL tablet 0.4 mg (has no administration in time range)  acetaminophen (TYLENOL) tablet 650 mg (has no administration in time range)  ondansetron (ZOFRAN) injection 4 mg (has no administration in time range)  metoprolol tartrate (LOPRESSOR) tablet 12.5 mg (has no administration in time range)  atorvastatin (LIPITOR) tablet 80 mg (has no administration in time range)  aspirin chewable tablet 324 mg (324 mg Oral Given 09/19/22 0518)  potassium chloride SA (KLOR-CON  M) CR tablet 40 mEq (40 mEq Oral Given 09/19/22 0518)  heparin bolus via infusion 4,000 Units (4,000 Units Intravenous Bolus from Bag 09/19/22 0534)     Procedures  /  Critical Care .Critical Care  Performed by: Montine Circle, PA-C Authorized by: Montine Circle, PA-C   Critical care provider statement:    Critical care time (minutes):  41   Critical care was necessary to treat or prevent imminent or life-threatening deterioration of the following conditions:  Circulatory failure   Critical care was time spent  personally by me on the following activities:  Development of treatment plan with patient or surrogate, discussions with consultants, evaluation of patient's response to treatment, examination of patient, ordering and review of laboratory studies, ordering and review of radiographic studies, ordering and performing treatments and interventions, pulse oximetry, re-evaluation of patient's condition and review of old charts   ED Course and Medical Decision Making  I have reviewed the triage vital signs, the nursing notes, and pertinent available records from the EMR.  Social Determinants Affecting Complexity of Care: Patient has no clinically significant social determinants affecting this chief complaint..   ED Course:    Medical Decision Making Patient with CP.  Exertional.  High risk.  Elevated trops.  Concern for NSTEMI.  No treatments PTA. Will give ASA, heparin, and nitro.    Problems Addressed: NSTEMI (non-ST elevated myocardial infarction) [facility]): acute illness or injury that poses a threat to life or bodily functions  Amount and/or Complexity of Data Reviewed Labs: ordered.    Details: Trop 48->83 Hyperglycemic to 298 K is 3.3 Radiology: ordered and independent interpretation performed.    Details: No opacity on CXR  Risk OTC drugs. Prescription drug management. Decision regarding hospitalization.     Consultants: I discussed the case with Dr. Foye Clock, who is appreciated for seeing the patient.   Treatment and Plan: Patient's exam and diagnostic results are concerning for NSTEMI.  Feel that patient will need admission to the hospital for further treatment and evaluation.  Patient seen by and discussed with attending physician, Dr. Randal Buba, who agrees with plan for admission.  Final Clinical Impressions(s) / ED Diagnoses     ICD-10-CM   1. NSTEMI (non-ST elevated myocardial infarction) [facility])  I21.4       ED Discharge Orders     None         Discharge  Instructions Discussed with and Provided to Patient:   Discharge Instructions   None      Montine Circle, PA-C 09/19/22 [place], April, MD 09/19/22 [phone]

## 2022-09-19 NOTE — ED Notes (Signed)
Trop called from the lab  value 50  dr Betsey Holiday notified

## 2022-09-19 NOTE — H&P (Addendum)
Cardiology Admission History and Physical   Patient ID: ARTY LANTZY MRN: [id number]; DOB: 10-Jan-1961   Admission date: 09/18/2022  PCP:  Maximiano Coss, NP   [place] Providers Cardiologist:  None        Chief Complaint:  chest pain  Patient Profile:   Blake Smith is a 62 y.o. male with HTN, HLD, DM2 on metformin who is being seen 09/19/2022 for the evaluation of chest pain.  History of Present Illness:   Blake Smith 62 y.o. male with HTN, HLD, DM2 on metformin who is being seen 09/19/2022 for the evaluation of chest pain.  Patient states that yesterday started noticing exertional chest pain that was pressure-like substernal that relieved with rest.  Never had these prior symptoms before.  Was out working in the yard this afternoon and his pain returned.  He describes it as an elephant sitting on his chest, persisted with slight improvement with rest but knew something was wrong so he came to the ED.  In the ED his pain improved until he went and walked to his car which worsened his chest pressure more.  Currently having 2 out of 10 chest pain at rest.  He denies concurrent nausea, vomiting, diaphoresis, shortness of breath, palpitations, or lightheadedness.  He has a history of hypertension, hyperlipidemia, and diabetes.  No significant family history of coronary artery disease to his knowledge.  No history of allergies to medications or history of CVA.  On arrival to the ED was hemodynamically stable but hypertensive with systolics [phone].  Labs were notable for hyperglycemia.  Troponin 48-83.  EKG notable for normal sinus rhythm with inferior Q waves and ST depression as well as lateral ST depressions.  No ST elevation.  He was started on nitro, heparin, and aspirin in the ED.  Cardiology consulted for ACS.   Past Medical History:  Diagnosis Date   Allergy    Bone spur    dorssal distal  R foot   Erectile dysfunction    GERD (gastroesophageal reflux  disease)    Hypertension     Past Surgical History:  Procedure Laterality Date   BICEPS TENDON REPAIR     ROTATOR CUFF REPAIR     bilateral   TONSILLECTOMY AND ADENOIDECTOMY     undescended testicle     lesion penis removed  62 y/o     Medications Prior to Admission: Prior to Admission medications   Medication Sig Start Date End Date Taking? Authorizing Provider  acyclovir (ZOVIRAX) 800 MG tablet TAKE 1 TABLET(800 MG) BY MOUTH TWICE DAILY Patient taking differently: Take 800 mg by mouth daily as needed (for fever blisters). 06/06/20  Yes Maximiano Coss, NP  cetirizine (ZYRTEC) 10 MG tablet Take 10 mg by mouth daily.   Yes [provider]  LANSOPRAZOLE PO Take 1 tablet by mouth daily.   Yes [provider]  metFORMIN (GLUCOPHAGE) 1000 MG tablet TAKE 1 TABLET(1000 MG) BY MOUTH TWICE DAILY WITH A MEAL Patient not taking: Reported on 09/19/2022 05/01/22   Wendie Agreste, MD  ranitidine (ZANTAC) 150 MG tablet Take 150 mg by mouth 2 (two) times daily.    [provider]  rosuvastatin (CRESTOR) 10 MG tablet Take 1 tablet (10 mg total) by mouth daily. 10/03/21   Maximiano Coss, NP  sildenafil (VIAGRA) 100 MG tablet Take 0.5-1 tablets (50-100 mg total) by mouth daily as needed for erectile dysfunction. 10/22/21   Maximiano Coss, NP     Allergies:  Allergies  Allergen Reactions   Metformin And Related Diarrhea    Social History:   Social History   Socioeconomic History   Marital status: Married    Spouse name: Not on file   Number of children: Not on file   Years of education: Not on file   Highest education level: Not on file  Occupational History   Occupation: Chemical engineer: UNEMPLOYED  Tobacco Use   Smoking status: Never   Smokeless tobacco: Current    Types: Snuff  Vaping Use   Vaping Use: Never used  Substance and Sexual Activity   Alcohol use: Yes    Alcohol/week: 1.0 standard drink of alcohol    Types: 1 Standard drinks or  equivalent per week    Comment: seldom - rare    Drug use: No   Sexual activity: Yes  Other Topics Concern   Not on file  Social History Narrative   Not on file   Social Determinants of Health   Financial Resource Strain: Not on file  Food Insecurity: Not on file  Transportation Needs: Not on file  Physical Activity: Not on file  Stress: Not on file  Social Connections: Not on file  Intimate Partner Violence: Not on file    Family History:   The patient's family history includes Diabetes in his father; Hypertension in his father; Stroke (age of onset: 76) in his father. There is no history of Colon cancer, Colon polyps, Esophageal cancer, Rectal cancer, or Stomach cancer.    ROS:  Please see the history of present illness.  All other ROS reviewed and negative.     Physical Exam/Data:   Vitals:   09/18/22 2302 09/18/22 2310 09/19/22 0213 09/19/22 0515  BP: (!) 179/115  (!) 179/116 (!) 198/114  Pulse: 82  86 76  Resp: 16  16 (!) 22  Temp: 97.7 F (36.5 C)  98 F (36.7 C)   TempSrc:   Oral   SpO2: 96%  96% 100%  Weight:  108.9 kg    Height:  '5\' 8"'$  (1.727 m)     No intake or output data in the 24 hours ending 09/19/22 0536    09/18/2022   11:10 PM 10/03/2021    3:23 PM 03/22/2020    1:51 PM  Last 3 Weights  Weight (lbs) 240 lb 240 lb 6.4 oz 230 lb  Weight (kg) 108.863 kg 109.045 kg 104.327 kg     Body mass index is 36.49 kg/m.  General:  Well nourished, well developed, in no acute distress HEENT: normal Neck: no JVD Vascular: No carotid bruits; Distal pulses 2+ bilaterally   Cardiac:  normal S1, S2; RRR; no murmur  Lungs:  clear to auscultation bilaterally, no wheezing, rhonchi or rales  Abd: soft, nontender, no hepatomegaly  Ext: 1+ edema Musculoskeletal:  No deformities, BUE and BLE strength normal and equal Skin: warm and dry  Neuro:  CNs 2-12 intact, no focal abnormalities noted Psych:  Normal affect    EKG:  The ECG that was done was personally  reviewed and demonstrates: Sinus rhythm with inferior Q waves and ST depressions, V4 through V6 ST depressions  Relevant CV Studies: No prior  Laboratory Data:  High Sensitivity Troponin:   Recent Labs  Lab 09/18/22 2311 09/19/22 0126  TROPONINIHS 48* 83*      Chemistry Recent Labs  Lab 09/18/22 2311  NA 133*  K 3.3*  CL 99  CO2 23  GLUCOSE 375*  BUN 9  CREATININE 0.83  CALCIUM 9.4  GFRNONAA >60  ANIONGAP 11    No results for input(s): "PROT", "ALBUMIN", "AST", "ALT", "ALKPHOS", "BILITOT" in the last 168 hours. Lipids No results for input(s): "CHOL", "TRIG", "HDL", "LABVLDL", "LDLCALC", "CHOLHDL" in the last 168 hours. Hematology Recent Labs  Lab 09/18/22 2311  WBC 7.2  RBC 4.92  HGB 16.0  HCT 45.3  MCV 92.1  MCH 32.5  MCHC 35.3  RDW 11.6  PLT 233   Thyroid No results for input(s): "TSH", "FREET4" in the last 168 hours. BNPNo results for input(s): "BNP", "PROBNP" in the last 168 hours.  DDimer No results for input(s): "DDIMER" in the last 168 hours.   Radiology/Studies:  DG Chest 2 View  Result Date: 09/18/2022 CLINICAL DATA:  Chest pain. EXAM: CHEST - 2 VIEW COMPARISON:  November 28, 2012 FINDINGS: The heart size and mediastinal contours are within normal limits. Low lung volumes are noted. Both lungs are clear. The visualized skeletal structures are unremarkable. IMPRESSION: No active cardiopulmonary disease. Electronically Signed   By: Virgina Norfolk M.D.   On: 09/18/2022 23:36     Assessment and Plan:   Non-ST elevation myocardial infarction 2.  Hypertension, hyperlipidemia, diabetes Patient presents with typical new anginal symptoms, elevated biomarkers with EKG changes in the setting of significant cardiovascular risk factors concerning for acute coronary syndrome with non-ST elevated myocardial infarction.  He has been aspirin loaded and started on heparin.  Nitro started for blood pressure and symptom control in the ED but he clinically looks well.   Will hold off any P2 Y12 inhibitor at this time given possibility for multivessel disease.  Will start guideline directed medical therapy for coronary artery disease. -Continue heparin -Continue nitro for chest pain -Status post aspirin load -Start atorvastatin 80 mg -Start metoprolol 12.5 mg twice daily -TTE ordered -N.p.o. for possible catheterization but non-urgent given pain controlled at this time, GRACE score 82 -Ordered lipids, LipoA, A1c, TSH  Risk Assessment/Risk Scores:    TIMI Risk Score for Unstable Angina or Non-ST Elevation MI:   The patient's TIMI risk score is  , which indicates a  % risk of all cause mortality, new or recurrent myocardial infarction or need for urgent revascularization in the next 14 days.    Severity of Illness: The appropriate patient status for this patient is INPATIENT. Inpatient status is judged to be reasonable and necessary in order to provide the required intensity of service to ensure the patient's safety. The patient's presenting symptoms, physical exam findings, and initial radiographic and laboratory data in the context of their chronic comorbidities is felt to place them at high risk for further clinical deterioration. Furthermore, it is not anticipated that the patient will be medically stable for discharge from the hospital within 2 midnights of admission.   * I certify that at the point of admission it is my clinical judgment that the patient will require inpatient hospital care spanning beyond 2 midnights from the point of admission due to high intensity of service, high risk for further deterioration and high frequency of surveillance required.*   For questions or updates, please contact [place] Please consult [email or web address] for contact info under     Signed, Silas Flood, MD  09/19/2022 5:36 AM

## 2022-09-20 ENCOUNTER — Telehealth: Payer: Self-pay | Admitting: Physician Assistant

## 2022-09-20 ENCOUNTER — Encounter (HOSPITAL_COMMUNITY): Payer: Self-pay | Admitting: Student

## 2022-09-20 ENCOUNTER — Other Ambulatory Visit (HOSPITAL_COMMUNITY): Payer: Self-pay

## 2022-09-20 DIAGNOSIS — E669 Obesity, unspecified: Secondary | ICD-10-CM | POA: Diagnosis present

## 2022-09-20 DIAGNOSIS — I2510 Atherosclerotic heart disease of native coronary artery without angina pectoris: Secondary | ICD-10-CM

## 2022-09-20 DIAGNOSIS — Z833 Family history of diabetes mellitus: Secondary | ICD-10-CM | POA: Diagnosis not present

## 2022-09-20 DIAGNOSIS — Z79899 Other long term (current) drug therapy: Secondary | ICD-10-CM | POA: Diagnosis not present

## 2022-09-20 DIAGNOSIS — E785 Hyperlipidemia, unspecified: Secondary | ICD-10-CM | POA: Diagnosis present

## 2022-09-20 DIAGNOSIS — I10 Essential (primary) hypertension: Secondary | ICD-10-CM | POA: Diagnosis present

## 2022-09-20 DIAGNOSIS — Z955 Presence of coronary angioplasty implant and graft: Secondary | ICD-10-CM | POA: Diagnosis not present

## 2022-09-20 DIAGNOSIS — Z888 Allergy status to other drugs, medicaments and biological substances status: Secondary | ICD-10-CM | POA: Diagnosis not present

## 2022-09-20 DIAGNOSIS — E1165 Type 2 diabetes mellitus with hyperglycemia: Secondary | ICD-10-CM | POA: Diagnosis present

## 2022-09-20 DIAGNOSIS — E1169 Type 2 diabetes mellitus with other specified complication: Secondary | ICD-10-CM | POA: Insufficient documentation

## 2022-09-20 DIAGNOSIS — E876 Hypokalemia: Secondary | ICD-10-CM | POA: Diagnosis present

## 2022-09-20 DIAGNOSIS — K219 Gastro-esophageal reflux disease without esophagitis: Secondary | ICD-10-CM | POA: Diagnosis present

## 2022-09-20 DIAGNOSIS — I214 Non-ST elevation (NSTEMI) myocardial infarction: Secondary | ICD-10-CM | POA: Diagnosis not present

## 2022-09-20 DIAGNOSIS — I25110 Atherosclerotic heart disease of native coronary artery with unstable angina pectoris: Secondary | ICD-10-CM | POA: Diagnosis present

## 2022-09-20 DIAGNOSIS — Z6836 Body mass index (BMI) 36.0-36.9, adult: Secondary | ICD-10-CM | POA: Diagnosis not present

## 2022-09-20 DIAGNOSIS — Z8249 Family history of ischemic heart disease and other diseases of the circulatory system: Secondary | ICD-10-CM | POA: Diagnosis not present

## 2022-09-20 HISTORY — DX: Atherosclerotic heart disease of native coronary artery without angina pectoris: I25.10

## 2022-09-20 HISTORY — DX: Hyperlipidemia, unspecified: E78.5

## 2022-09-20 LAB — CBC
HCT: 37.9 % — ABNORMAL LOW (ref 39.0–52.0)
Hemoglobin: 14 g/dL (ref 13.0–17.0)
MCH: 32.9 pg (ref 26.0–34.0)
MCHC: 36.9 g/dL — ABNORMAL HIGH (ref 30.0–36.0)
MCV: 89.2 fL (ref 80.0–100.0)
Platelets: 175 10*3/uL (ref 150–400)
RBC: 4.25 MIL/uL (ref 4.22–5.81)
RDW: 11.6 % (ref 11.5–15.5)
WBC: 9.9 10*3/uL (ref 4.0–10.5)
nRBC: 0 % (ref 0.0–0.2)

## 2022-09-20 LAB — HEPATIC FUNCTION PANEL
ALT: 36 U/L (ref 0–44)
AST: 45 U/L — ABNORMAL HIGH (ref 15–41)
Albumin: 3.4 g/dL — ABNORMAL LOW (ref 3.5–5.0)
Alkaline Phosphatase: 47 U/L (ref 38–126)
Bilirubin, Direct: 0.2 mg/dL (ref 0.0–0.2)
Indirect Bilirubin: 0.7 mg/dL (ref 0.3–0.9)
Total Bilirubin: 0.9 mg/dL (ref 0.3–1.2)
Total Protein: 5.6 g/dL — ABNORMAL LOW (ref 6.5–8.1)

## 2022-09-20 LAB — BASIC METABOLIC PANEL
Anion gap: 9 (ref 5–15)
BUN: 7 mg/dL (ref 6–20)
CO2: 24 mmol/L (ref 22–32)
Calcium: 8.9 mg/dL (ref 8.9–10.3)
Chloride: 99 mmol/L (ref 98–111)
Creatinine, Ser: 0.77 mg/dL (ref 0.61–1.24)
GFR, Estimated: >60 mL/min (ref >60)
Glucose, Bld: 237 mg/dL — ABNORMAL HIGH (ref 70–99)
Potassium: 3.6 mmol/L (ref 3.5–5.1)
Sodium: 132 mmol/L — ABNORMAL LOW (ref 135–145)

## 2022-09-20 LAB — GLUCOSE, CAPILLARY
Glucose-Capillary: 253 mg/dL — ABNORMAL HIGH (ref 70–99)
Glucose-Capillary: 184 mg/dL — ABNORMAL HIGH (ref 70–99)
Glucose-Capillary: 179 mg/dL — ABNORMAL HIGH (ref 70–99)

## 2022-09-20 LAB — HIV ANTIBODY (ROUTINE TESTING W REFLEX): HIV Screen 4th Generation wRfx: NONREACTIVE

## 2022-09-20 MED ORDER — ASPIRIN 81 MG PO TBEC
81.0000 mg | DELAYED_RELEASE_TABLET | Freq: Every day | ORAL | 5 refills | Status: DC
  Filled 2022-09-20: qty 30, 30d supply, fill #0

## 2022-09-20 MED ORDER — LOSARTAN POTASSIUM 50 MG PO TABS: 50.0000 mg | ORAL_TABLET | Freq: Every day | ORAL | 5 refills | Status: DC

## 2022-09-20 MED ORDER — EMPAGLIFLOZIN 25 MG PO TABS
25.0000 mg | ORAL_TABLET | Freq: Every day | ORAL | Status: DC
  Administered 2022-09-20: 25 mg via ORAL
  Filled 2022-09-20: qty 1

## 2022-09-20 MED ORDER — EMPAGLIFLOZIN 10 MG PO TABS: 10.0000 mg | ORAL_TABLET | Freq: Every day | ORAL | Status: DC

## 2022-09-20 MED ORDER — NITROGLYCERIN 0.4 MG SL SUBL: 0.4000 mg | SUBLINGUAL_TABLET | SUBLINGUAL | 3 refills | Status: DC | PRN

## 2022-09-20 MED ORDER — LOSARTAN POTASSIUM 50 MG PO TABS
50.0000 mg | ORAL_TABLET | Freq: Every day | ORAL | 5 refills | Status: DC
  Filled 2022-09-20: qty 30, 30d supply, fill #0

## 2022-09-20 MED ORDER — EMPAGLIFLOZIN 10 MG PO TABS: 10.0000 mg | ORAL_TABLET | Freq: Every day | ORAL | 5 refills | Status: DC

## 2022-09-20 MED ORDER — AMLODIPINE BESYLATE 5 MG PO TABS
5.0000 mg | ORAL_TABLET | Freq: Every day | ORAL | Status: DC
  Administered 2022-09-20: 5 mg via ORAL
  Filled 2022-09-20: qty 1

## 2022-09-20 MED ORDER — ACCU-CHEK SOFTCLIX LANCETS MISC
0 refills | Status: AC
  Filled 2022-09-20: qty 100, 25d supply, fill #0

## 2022-09-20 MED ORDER — ATORVASTATIN CALCIUM 80 MG PO TABS: 80.0000 mg | ORAL_TABLET | Freq: Every day | ORAL | 5 refills | Status: DC

## 2022-09-20 MED ORDER — CARVEDILOL 6.25 MG PO TABS: 6.2500 mg | ORAL_TABLET | Freq: Two times a day (BID) | ORAL | 5 refills | Status: DC

## 2022-09-20 MED ORDER — EMPAGLIFLOZIN 10 MG PO TABS
10.0000 mg | ORAL_TABLET | Freq: Every day | ORAL | 5 refills | Status: DC
  Filled 2022-09-20 (×2): qty 30, 30d supply, fill #0

## 2022-09-20 MED ORDER — GLUCOSE BLOOD VI STRP
ORAL_STRIP | 0 refills | Status: AC
  Filled 2022-09-20: qty 100, 25d supply, fill #0

## 2022-09-20 MED ORDER — POTASSIUM CHLORIDE CRYS ER 20 MEQ PO TBCR
20.0000 meq | EXTENDED_RELEASE_TABLET | Freq: Once | ORAL | Status: AC
  Administered 2022-09-20: 20 meq via ORAL
  Filled 2022-09-20: qty 1

## 2022-09-20 MED ORDER — CARVEDILOL 6.25 MG PO TABS
6.2500 mg | ORAL_TABLET | Freq: Two times a day (BID) | ORAL | 5 refills | Status: DC
  Filled 2022-09-20: qty 60, 30d supply, fill #0

## 2022-09-20 MED ORDER — ATORVASTATIN CALCIUM 80 MG PO TABS
80.0000 mg | ORAL_TABLET | Freq: Every day | ORAL | 5 refills | Status: DC
  Filled 2022-09-20: qty 30, 30d supply, fill #0

## 2022-09-20 MED ORDER — TICAGRELOR 90 MG PO TABS: 90.0000 mg | ORAL_TABLET | Freq: Two times a day (BID) | ORAL | 11 refills | Status: DC

## 2022-09-20 MED ORDER — TICAGRELOR 90 MG PO TABS
90.0000 mg | ORAL_TABLET | Freq: Two times a day (BID) | ORAL | 11 refills | Status: DC
  Filled 2022-09-20: qty 60, 30d supply, fill #0

## 2022-09-20 MED ORDER — NITROGLYCERIN 0.4 MG SL SUBL
0.4000 mg | SUBLINGUAL_TABLET | SUBLINGUAL | 3 refills | Status: DC | PRN
  Filled 2022-09-20: qty 25, 7d supply, fill #0

## 2022-09-20 MED ORDER — AMLODIPINE BESYLATE 5 MG PO TABS
5.0000 mg | ORAL_TABLET | Freq: Every day | ORAL | 5 refills | Status: DC
  Filled 2022-09-20: qty 30, 30d supply, fill #0

## 2022-09-20 MED ORDER — CARVEDILOL 6.25 MG PO TABS
6.2500 mg | ORAL_TABLET | Freq: Two times a day (BID) | ORAL | Status: DC
  Administered 2022-09-20: 6.25 mg via ORAL
  Filled 2022-09-20: qty 1

## 2022-09-20 MED ORDER — LOSARTAN POTASSIUM 50 MG PO TABS
50.0000 mg | ORAL_TABLET | Freq: Every day | ORAL | Status: DC
  Administered 2022-09-20: 50 mg via ORAL
  Filled 2022-09-20: qty 1

## 2022-09-20 MED ORDER — ASPIRIN 81 MG PO TBEC: 81.0000 mg | DELAYED_RELEASE_TABLET | Freq: Every day | ORAL | Status: DC

## 2022-09-20 MED ORDER — BLOOD GLUCOSE METER KIT
PACK | 0 refills | Status: AC
  Filled 2022-09-20: qty 1, 25d supply, fill #0

## 2022-09-20 MED ORDER — AMLODIPINE BESYLATE 5 MG PO TABS: 5.0000 mg | ORAL_TABLET | Freq: Every day | ORAL | 5 refills | Status: DC

## 2022-09-20 MED ORDER — LABETALOL HCL 5 MG/ML IV SOLN
10.0000 mg | Freq: Once | INTRAVENOUS | Status: AC
  Administered 2022-09-20: 10 mg via INTRAVENOUS
  Filled 2022-09-20: qty 4

## 2022-09-20 MED ORDER — CARVEDILOL 6.25 MG PO TABS: 6.2500 mg | ORAL_TABLET | Freq: Two times a day (BID) | ORAL | Status: DC

## 2022-09-20 NOTE — Inpatient Diabetes Management (Signed)
Inpatient Diabetes Program Recommendations  AACE/ADA: New Consensus Statement on Inpatient Glycemic Control (2015)  Target Ranges:  Prepandial:   less than 140 mg/dL      Peak postprandial:   less than 180 mg/dL (1-2 hours)      Critically ill patients:  140 - 180 mg/dL   Lab Results  Component Value Date   GLUCAP 184 (H) 09/20/2022   HGBA1C 8.8 (H) 09/19/2022    Review of Glycemic Control  Diabetes history: DM 2 Outpatient Diabetes medications: None metformin in the past (intolerance) Current orders for Inpatient glycemic control:  Jardiance 10 mg Daily Novolog 0-15 units tid + hs  A1c 8.8% Pt will need glucose meter kit (order #[id number]) at time of d/c.  Spoke with pt and wife at bedside regarding A1c level. Discussed glucose and A1c goals. Discussed lifestyle modifications with diet and exercising. Discussed the steps in checking glucose at home. 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 Discussed that the doctors will prescribe Jardiance at time of d/c. Messaged Dayna Dunn, PA, regarding glucometer at time of d/c.   Thanks,  Tama Headings RN, MSN, BC-ADM Inpatient Diabetes Coordinator Team Pager [phone] (8a-5p)

## 2022-09-20 NOTE — Discharge Summary (Signed)
Discharge Summary    Patient ID: Blake Smith MRN: [id number]; DOB: Feb 10, 1961  Admit date: 09/18/2022 Discharge date: 09/20/2022  PCP:  Maximiano Coss, NP   [facility] Cardiologist:  Sherren Mocha, MD        Discharge Diagnoses    Principal Problem:   Non-STEMI (non-ST elevated myocardial infarction) [facility]) Active Problems:   HTN (hypertension)   CAD in native artery   Hyperlipidemia LDL goal <70   Diabetes mellitus type 2 in obese [facility])    Diagnostic Studies/Procedures     [facility] 09/19/22    Lat 1st Diag lesion is 99% stenosed.   A drug-eluting stent was successfully placed using a STENT ONYX FRONTIER 3.5X18.   Post intervention, there is a 0% residual stenosis.   PROCEDURE DESCRIPTION:    The patient was brought to the second floor [facility] cath lab in the postabsorptive state. He was premedicated with IV Versed and fentanyl. His right wrist was prepped and shaved in usual sterile fashion. Xylocaine 1% was used for local anesthesia. A 6 French sheath was inserted into the right radial artery using standard Seldinger technique. The patient received 13,000 units  of heparin intravenously.  A 5 [place] TIG catheter, right left Judkins diagnostic catheters were used for selective coronary angiography and obtain left heart pressures.  Isovue dye is used for the entirety of the case (total contrast the patient 150 cc).  Retrograde aortic, ventricular and pullback pressures were recorded.  Radial cocktail was administered via the SideArm sheath.   The ACT was measured at 358.  Patient received Brilinta 180 mg p.o. loading dose.  Using a 6 [place] XB LAD 3.0 cm guide catheter along with a 0.14 Prowater guidewire I was able to cross the inferolateral branch of the large first diagonal branch with little difficulty.  Isovue dye is used for the entirety of the intervention.  Retroaortic pressures monitored in the case.   I predilated the lesion with a 2.0 x  12 mm long balloon.  I then stented across the bifurcation into the inferior branch of the diagonal bifurcation with a 3.5 mm x 18 mm long Medtronic Onyx frontier drug-eluting stent deployed at 14 atm (3.60 mm) resulting reduction of a 99% stenosis to 0% residual.  The superior branch remained widely patent.  I elected not to postdilated because the stent appeared well opposed I did not want to risk losing the superior branch.     IMPRESSION: [name] had a non-STEMI with a high-grade diagonal branch stenosis at the bifurcation successfully treated with a 3.5 x 18 mm long Medtronic Onyx frontier drug-eluting stent.  The remainder of his anatomy was free of significant disease.  He will need DAPT uninterrupted for 12 months including aspirin and Brilinta along with risk factor modification including high-dose statin therapy and beta-blocker.  The sheath was removed and a TR band was placed on the right wrist to achieve patent hemostasis.  The patient left lab in stable condition.  I anticipate discharge tomorrow.   Quay Burow. MD, [facility] 09/19/2022 4:21 PM   2D echo 09/19/22   1. Left ventricular ejection fraction, by estimation, is 55 to 60%. The  left ventricle has normal function. The left ventricle has no regional  wall motion abnormalities. There is moderate hypertrophy of the basal  septum. The rest of the LV segments  demonstrate mild left ventricular hypertrophy. Left ventricular diastolic  parameters are consistent with Grade I diastolic dysfunction (impaired  relaxation).  2. Right ventricular systolic function is normal. The right ventricular  size is normal. Tricuspid regurgitation signal is inadequate for assessing  PA pressure.   3. The mitral valve is normal in structure. Trivial mitral valve  regurgitation.   4. The aortic valve is tricuspid. Aortic valve regurgitation is not  visualized. No aortic stenosis is present.   Comparison(s): No prior Echocardiogram.     _____________   History of Present Illness     Blake Smith is a 62 y.o. 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 He was found to have elevated troponin c/w NSTEMI.  EKG notable for normal sinus rhythm with inferior Q waves and ST depression as well as lateral ST depressions. No ST elevation. He was admitted to the cardiology service.  Hospital Course     1. NSTEMI with CAD - troponins 48->83 - echo EF 55-60%, moderate hypertrophy of basal septum, G1DD - 99% Lat 1st diag tx with DES  - now on ASA, Brilinta, atorvastatin, SL NTG rx - close OP f/u scheduled - educated on avoiding SL NTG within 48 hours of sildenafil (PTA med) and vice versa due to risk of hypotension   2. Essential HTN - reports being on blood pressure medicine in the past but has essentially been off all medications for quite some time, prior hx of running out of meds in 2021 - BP remains elevated - metoprolol was switched to carvedilol 6.'25mg'$  BID and losartan '50mg'$  started though BP still increased this afternoon - Dr. Burt Knack recommended also starting amlodipine '5mg'$  daily and dose labetalol x1. He felt patient was stable for discharge after recheck BP 160/100 at rest -> the patient felt that remaining in the hospital was contributing to his high blood pressure so Dr. Burt Knack felt comfortable with discharge home since he is completely asymptomatic and otherwise medically ready for discharge - advised to monitor at home and call if SBP running >130 so we can further adjust medicines if needed - he confirms he has access to BP cuff - recommend labs at f/u given initiation of ARB, SGLT2i   3. Hyperlipidemia - LDL 136, HDL 48, trig 255 - Started on atorvastatin 80 mg daily - AST marginally up, can trend as OP with CMET - If the patient is  tolerating statin at time of follow-up appointment, would consider rechecking liver function/lipid panel in 6-8 weeks   4. Diabetes mellitus - A1C 8.8 - prior intolerance to metformin with diarrhea - we will start SGLT2i at DC with close f/u PCP to discuss next options   5. Hypokalemia - improved with supplementation -> started on losartan so would follow as OP - Na 132 but corrects to 135 for glucose  Dr. Burt Knack has seen and examined the patient today and feels he is stable for discharge. He feels the patient may return to driving and work on Tuesday 09/24/22 but with light duty for 1 week. Meds were sent to [facility] and pharmD also sent refills to pt's usual pharmacy.     Did the patient have an acute coronary syndrome (MI, NSTEMI, STEMI, etc) this admission?:  Yes                               AHA/ACC  Clinical Performance & Quality Measures: Aspirin prescribed? - Yes ADP Receptor Inhibitor (Plavix/Clopidogrel, Brilinta/Ticagrelor or Effient/Prasugrel) prescribed (includes medically managed patients)? - Yes Beta Blocker prescribed? - Yes High Intensity Statin (Lipitor 40-'80mg'$  or Crestor 20-'40mg'$ ) prescribed? - Yes EF assessed during THIS hospitalization? - Yes For EF <40%, was ACEI/ARB prescribed? - Not Applicable (EF >/= 76%) For EF <40%, Aldosterone Antagonist (Spironolactone or Eplerenone) prescribed? - Not Applicable (EF >/= 19%) Cardiac Rehab Phase II ordered (including medically managed patients)? - Yes       The patient will be scheduled for a TOC follow up appointment in 14 days.  A message has been sent to the [facility] and Scheduling Pool at the office where the patient should be seen for follow up.    You had prior old prescriptions listed on your medicine list including acyclovir  _____________  Discharge Vitals Blood pressure (!) 165/100, pulse 76, temperature 98 F (36.7 C), temperature source Oral, resp. rate 18, height '5\' 8"'$  (1.727 m), weight 106.4 kg, SpO2 98  %.  Filed Weights   09/18/22 2310 09/20/22 0511  Weight: 108.9 kg 106.4 kg    Labs & Radiologic Studies    CBC Recent Labs    09/18/22 2311 09/20/22 0236  WBC 7.2 9.9  NEUTROABS 5.0  --   HGB 16.0 14.0  HCT 45.3 37.9*  MCV 92.1 89.2  PLT 233 509   Basic Metabolic Panel Recent Labs    09/19/22 0507 09/20/22 0236  NA 132* 132*  K 3.4* 3.6  CL 98 99  CO2 24 24  GLUCOSE 301* 237*  BUN 8 7  CREATININE 0.78 0.77  CALCIUM 9.3 8.9  MG 1.9  --    Liver Function Tests Recent Labs    09/20/22 0236  AST 45*  ALT 36  ALKPHOS 47  BILITOT 0.9  PROT 5.6*  ALBUMIN 3.4*   No results for input(s): "LIPASE", "AMYLASE" in the last 72 hours. High Sensitivity Troponin:   Recent Labs  Lab 09/18/22 2311 09/19/22 0126  TROPONINIHS 48* 83*    BNP Invalid input(s): "POCBNP" D-Dimer No results for input(s): "DDIMER" in the last 72 hours. Hemoglobin A1C Recent Labs    09/19/22 0542  HGBA1C 8.8*   Fasting Lipid Panel Recent Labs    09/19/22 0542  CHOL 235*  HDL 48  LDLCALC 136*  TRIG 255*  CHOLHDL 4.9   Thyroid Function Tests Recent Labs    09/19/22 0542  TSH 4.114   _____________  CARDIAC CATHETERIZATION  Result Date: 09/19/2022 Images from the original result were not included.   Lat 1st Diag lesion is 99% stenosed.   A drug-eluting stent was successfully placed using a STENT ONYX FRONTIER 3.5X18.   Post intervention, there is a 0% residual stenosis. Blake Smith is a 62 y.o. male  [id number] LOCATION:  FACILITY: [facility] PHYSICIAN: Quay Burow, M.D. 09-02-1961 DATE OF PROCEDURE:  09/19/2022 DATE OF DISCHARGE: CARDIAC CATHETERIZATION / PCI DES Diag History obtained from chart review.62 y.o. male with HTN, HLD, DM2 on metformin who was seen 09/19/2022 for the evaluation of chest pain.  His troponins were positive/non-STEMI.  He presents now for diagnostic coronary angiography. PROCEDURE DESCRIPTION: The patient was brought to the second floor [facility] cath  lab in the postabsorptive state. He was premedicated with IV Versed and fentanyl. His right wrist was prepped and shaved in usual sterile fashion. Xylocaine 1% was used for local anesthesia. A 6 French sheath was inserted into the right radial artery  using standard Seldinger technique. The patient received 13,000 units  of heparin intravenously.  A 5 [place] TIG catheter, right left Judkins diagnostic catheters were used for selective coronary angiography and obtain left heart pressures.  Isovue dye is used for the entirety of the case (total contrast the patient 150 cc).  Retrograde aortic, ventricular and pullback pressures were recorded.  Radial cocktail was administered via the SideArm sheath. The ACT was measured at 358.  Patient received Brilinta 180 mg p.o. loading dose.  Using a 6 [place] XB LAD 3.0 cm guide catheter along with a 0.14 Prowater guidewire I was able to cross the inferolateral branch of the large first diagonal branch with little difficulty.  Isovue dye is used for the entirety of the intervention.  Retroaortic pressures monitored in the case. I predilated the lesion with a 2.0 x 12 mm long balloon.  I then stented across the bifurcation into the inferior branch of the diagonal bifurcation with a 3.5 mm x 18 mm long Medtronic Onyx frontier drug-eluting stent deployed at 14 atm (3.60 mm) resulting reduction of a 99% stenosis to 0% residual.  The superior branch remained widely patent.  I elected not to postdilated because the stent appeared well opposed I did not want to risk losing the superior branch.   Mr. Cotham had a non-STEMI with a high-grade diagonal branch stenosis at the bifurcation successfully treated with a 3.5 x 18 mm long Medtronic Onyx frontier drug-eluting stent.  The remainder of his anatomy was free of significant disease.  He will need DAPT uninterrupted for 12 months including aspirin and Brilinta along with risk factor modification including high-dose statin therapy and  beta-blocker.  The sheath was removed and a TR band was placed on the right wrist to achieve patent hemostasis.  The patient left lab in stable condition.  I anticipate discharge tomorrow. Quay Burow. MD, [facility] 09/19/2022 4:21 PM    ECHOCARDIOGRAM COMPLETE  Result Date: 09/19/2022    ECHOCARDIOGRAM REPORT   Patient Name:   Blake Smith Date of Exam: 09/19/2022 Medical Rec #:  [id number]      Height:       68.0 in Accession #:    [id number]     Weight:       240.0 lb Date of Birth:  Feb 01, 1961      BSA:          2.208 m Patient Age:    62 years       BP:           159/94 mmHg Patient Gender: M              HR:           65 bpm. Exam Location:  Inpatient Procedure: 2D Echo, Cardiac Doppler, Color Doppler and Intracardiac            Opacification Agent Indications:    NSTEMI  History:        Patient has no prior history of Echocardiogram examinations.                 Signs/Symptoms:Chest Pain; Risk Factors:Hypertension,                 Dyslipidemia and Diabetes.  Sonographer:    Eartha Inch Referring Phys: Silas Flood  Sonographer Comments: Technically difficult study due to poor echo windows. Image acquisition challenging due to patient body habitus and Image acquisition challenging due to respiratory motion. IMPRESSIONS  1. Left ventricular ejection fraction,  by estimation, is 55 to 60%. The left ventricle has normal function. The left ventricle has no regional wall motion abnormalities. There is moderate hypertrophy of the basal septum. The rest of the LV segments demonstrate mild left ventricular hypertrophy. Left ventricular diastolic parameters are consistent with Grade I diastolic dysfunction (impaired relaxation).  2. Right ventricular systolic function is normal. The right ventricular size is normal. Tricuspid regurgitation signal is inadequate for assessing PA pressure.  3. The mitral valve is normal in structure. Trivial mitral valve regurgitation.  4. The aortic valve is tricuspid. Aortic  valve regurgitation is not visualized. No aortic stenosis is present. Comparison(s): No prior Echocardiogram. FINDINGS  Left Ventricle: Left ventricular ejection fraction, by estimation, is 55 to 60%. The left ventricle has normal function. The left ventricle has no regional wall motion abnormalities. The left ventricular internal cavity size was normal in size. There is  moderate hypertrophy of the basal septum. The rest of the LV segments demonstrate mild left ventricular hypertrophy. Left ventricular diastolic parameters are consistent with Grade I diastolic dysfunction (impaired relaxation). Right Ventricle: The right ventricular size is normal. No increase in right ventricular wall thickness. Right ventricular systolic function is normal. Tricuspid regurgitation signal is inadequate for assessing PA pressure. Left Atrium: Left atrial size was normal in size. Right Atrium: Right atrial size was normal in size. Pericardium: There is no evidence of pericardial effusion. Mitral Valve: The mitral valve is normal in structure. Trivial mitral valve regurgitation. MV peak gradient, 2.0 mmHg. The mean mitral valve gradient is 1.0 mmHg. Tricuspid Valve: The tricuspid valve is normal in structure. Tricuspid valve regurgitation is trivial. Aortic Valve: The aortic valve is tricuspid. Aortic valve regurgitation is not visualized. No aortic stenosis is present. Pulmonic Valve: The pulmonic valve was normal in structure. Pulmonic valve regurgitation is trivial. Aorta: The aortic root and ascending aorta are structurally normal, with no evidence of dilitation. Venous: The inferior vena cava was not well visualized. IAS/Shunts: The atrial septum is grossly normal.  LEFT VENTRICLE PLAX 2D LVIDd:         4.00 cm      Diastology LVIDs:         3.10 cm      LV e' medial:    5.44 cm/s LV PW:         1.40 cm      LV E/e' medial:  8.9 LV IVS:        1.30 cm      LV e' lateral:   6.53 cm/s LVOT diam:     2.00 cm      LV E/e' lateral:  7.4 LV SV:         71 LV SV Index:   32 LVOT Area:     3.14 cm  LV Volumes (MOD) LV vol d, MOD A2C: 134.0 ml LV vol d, MOD A4C: 144.0 ml LV vol s, MOD A2C: 42.1 ml LV vol s, MOD A4C: 59.4 ml LV SV MOD A2C:     91.9 ml LV SV MOD A4C:     144.0 ml LV SV MOD BP:      90.0 ml RIGHT VENTRICLE RV S prime:     10.20 cm/s TAPSE (M-mode): 2.0 cm LEFT ATRIUM           Index        RIGHT ATRIUM           Index LA diam:      3.70 cm 1.68 cm/m  RA Area:     10.50 cm LA Vol (A2C): 50.9 ml 23.05 ml/m  RA Volume:   17.40 ml  7.88 ml/m LA Vol (A4C): 44.8 ml 20.29 ml/m  AORTIC VALVE LVOT Vmax:   102.00 cm/s LVOT Vmean:  74.400 cm/s LVOT VTI:    0.225 m  AORTA Ao Root diam: 3.20 cm Ao Asc diam:  3.50 cm MITRAL VALVE MV Area (PHT): 3.08 cm    SHUNTS MV Area VTI:   2.91 cm    Systemic VTI:  0.22 m MV Peak grad:  2.0 mmHg    Systemic Diam: 2.00 cm MV Mean grad:  1.0 mmHg MV Vmax:       0.70 m/s MV Vmean:      46.2 cm/s MV Decel Time: 246 msec MV E velocity: 48.30 cm/s MV A velocity: 74.40 cm/s MV E/A ratio:  0.65 Gwyndolyn Kaufman MD Electronically signed by Gwyndolyn Kaufman MD Signature Date/Time: 09/19/2022/1:09:48 PM    Final    DG Chest 2 View  Result Date: 09/18/2022 CLINICAL DATA:  Chest pain. EXAM: CHEST - 2 VIEW COMPARISON:  November 28, 2012 FINDINGS: The heart size and mediastinal contours are within normal limits. Low lung volumes are noted. Smith lungs are clear. The visualized skeletal structures are unremarkable. IMPRESSION: No active cardiopulmonary disease. Electronically Signed   By: Virgina Norfolk M.D.   On: 09/18/2022 23:36   Disposition   Pt is being discharged home today in good condition.  Follow-up Plans & Appointments     Follow-up Information     Charlie Pitter, PA-C Follow up.   Specialties: Cardiology, Radiology Why: [place] location - cardiology follow-up scheduled Friday Oct 04, 2022 at 2:20 PM (Arrive by 2:05 PM). Lisbeth Renshaw is one of the PAs that works with our  cardiology team. Contact information: [address] [place] [address] [place] Follow up.   Specialty: Family Medicine Why: Please contact your primary care provider office to schedule a follow-up appointment to address your diabetes. Contact information: [address] [place] [phone]               Discharge Instructions     Amb Referral to Cardiac Rehabilitation   Complete by: As directed    Diagnosis:  Coronary Stents NSTEMI     After initial evaluation and assessments completed: Virtual Based Care may be provided alone or in conjunction with Phase 2 Cardiac Rehab based on patient barriers.: Yes   Intensive Cardiac Rehabilitation (ICR) [place] location only OR Traditional Cardiac Rehabilitation (TCR) *If criteria for ICR are not met will enroll in TCR [facility] only): Yes   Diet - low sodium heart healthy   Complete by: As directed    Discharge instructions   Complete by: As directed    Please review your medicine list for multiple changes.  You were sent home on 2 blood thinners to keep your stent open. It is very important to not miss any doses.  If you notice any bleeding such as blood in stool, black tarry stools, blood in urine, nosebleeds or any other unusual bleeding, call your doctor immediately. It is not normal to have this kind of bleeding while on a blood thinner and usually indicates there is an underlying problem with one of your body systems that needs to be checked out.   IMPORTANT: Do not take nitroglycerin if you  have taken sildenafil in the last 48 hours. The opposite is true as well. Do not take sildenafil if you have taken nitroglycerin in the last 48 hours. If you take these two medicines, they can cause low blood pressure if taken close together.   You had an expired prescription on your medicine list for acyclovir. Please discuss  resumption of this medicine/refill with your primary care provider.  It is EXTREMELY IMPORTANT that you follow up with your primary care doctor for your uncontrolled diabetes. Diabetes can easily lead to serious consequences such as heart attack, stroke, peripheral vascular disease including amputated limbs, kidney failure requiring 4-hour dialysis treatments 3 times a week, blindness, chronic pain from nerve damage, impotence, and early death. Please call today to make that appointment.  Please monitor your blood pressure occasionally at home. Call our office if you tend to get readings of greater than 130 on the top number or 80 on the bottom number.   Increase activity slowly   Complete by: As directed    No driving until 1/32/44. You may return to work on 09/24/22 but should remain on on light duty through 09/27/22. No lifting over 10 lbs for 2 weeks. No sexual activity for 2 weeks. Keep procedure site clean & dry. If you notice increased pain, swelling, bleeding or pus, call/return!  You may shower, but no soaking baths/hot tubs/pools for 1 week.        Discharge Medications   Allergies as of 09/20/2022       Reactions   Metformin And Related Diarrhea        Medication List     STOP taking these medications    acyclovir 800 MG tablet Commonly known as: ZOVIRAX       TAKE these medications    Accu-Chek Guide test strip Generic drug: glucose blood Use as directed   Accu-Chek Guide w/Device Kit Dispense based on patient and insurance preference. Use up to four times daily as directed. (FOR ICD-10 E10.9, E11.9).   Accu-Chek Softclix Lancets lancets Use as directed   amLODipine 5 MG tablet Commonly known as: NORVASC Take 1 tablet (5 mg total) by mouth daily.   Aspirin Low Dose 81 MG tablet Generic drug: aspirin EC Take 1 tablet (81 mg total) by mouth daily. Swallow whole. Start taking on: September 21, 2022   atorvastatin 80 MG tablet Commonly known as:  LIPITOR Take 1 tablet (80 mg total) by mouth daily. Start taking on: September 21, 2022   carvedilol 6.25 MG tablet Commonly known as: COREG Take 1 tablet (6.25 mg total) by mouth 2 (two) times daily with a meal.   cetirizine 10 MG tablet Commonly known as: ZYRTEC Take 10 mg by mouth daily.   empagliflozin 10 MG Tabs tablet Commonly known as: JARDIANCE Take 1 tablet (10 mg total) by mouth daily.   lansoprazole 15 MG disintegrating tablet Commonly known as: PREVACID SOLUTAB Take 15 mg by mouth daily.   losartan 50 MG tablet Commonly known as: COZAAR Take 1 tablet (50 mg total) by mouth daily.   nitroGLYCERIN 0.4 MG SL tablet Commonly known as: NITROSTAT Place 1 tablet (0.4 mg total) under the tongue every 5 (five) minutes as needed for chest pain (up to 3 doses. If taking 3rd dose, call 911). Notes to patient: Do not take dose if have taken sildenafil within last 24 hours   sildenafil 100 MG tablet Commonly known as: VIAGRA Take 0.5-1 tablets (50-100 mg total) by mouth daily as needed  for erectile dysfunction.   ticagrelor 90 MG Tabs tablet Commonly known as: BRILINTA Take 1 tablet (90 mg total) by mouth 2 (two) times daily.            Outstanding Labs/Studies   None ordered but see considerations above  Duration of Discharge Encounter   Greater than 30 minutes including physician time.  Signed, Charlie Pitter, PA-C 09/20/2022, 5:55 PM

## 2022-09-20 NOTE — Discharge Instructions (Signed)
Information about your medication: Brilinta (anti-platelet agent)  Generic Name (Brand): ticagrelor (Brilinta), twice daily medication  PURPOSE: You are taking this medication along with aspirin to lower your chance of having a heart attack, stroke, or blood clots in your heart stent. These can be fatal. Brilinta and aspirin help prevent platelets from sticking together and forming a clot that can block an artery or your stent.   Common SIDE EFFECTS you may experience include: bruising or bleeding more easily, shortness of breath  Do not stop taking BRILINTA without talking to the doctor who prescribes it for you. People who are treated with a stent and stop taking Brilinta too soon, have a higher risk of getting a blood clot in the stent, having a heart attack, or dying. If you stop Brilinta because of bleeding, or for other reasons, your risk of a heart attack or stroke may increase.   Avoid taking NSAID agents or anti-inflammatory medications such as ibuprofen, naproxen given increased bleed risk with plavix - can use acetaminophen (Tylenol) if needed for pain.  Tell all of your doctors and dentists that you are taking Brilinta. They should talk to the doctor who prescribed Brilinta for you before you have any surgery or invasive procedure.   Contact your health care provider if you experience: severe or uncontrollable bleeding, pink/red/brown urine, vomiting blood or vomit that looks like "coffee grounds", red or black stools (looks like tar), coughing up blood or blood clots ----------------------------------------------------------------------------------------------------------------------

## 2022-09-20 NOTE — Progress Notes (Addendum)
Patient's A1c is 8.8. Diabetes Coordinator contacted. Awaiting call back. Suanne Marker, diabetes coordinator called back, results communicated with her, and she'll take a look at it.

## 2022-09-20 NOTE — Care Management (Addendum)
  Transition of Care [facility]) Screening Note   Patient Details  Name: Blake Smith Date of Birth: [birth year]   Transition of Care [facility]) CM/SW Contact:    Bethena Roys, RN Phone Number: 09/20/2022, 2:05 PM    Transition of Care Department [facility]) has reviewed the patient and no TOC needs have been identified at this time. Brilinta co pay is $50.00- co pay card provided to the patient. Diabetes Coordinator has spoken with the patient regarding  A1c 8.8%. Medications will be delivered to patient via [place]. We will continue to monitor patient advancement through interdisciplinary progression rounds. If new patient transition needs arise, please place a TOC consult.

## 2022-09-20 NOTE — Progress Notes (Signed)
CARDIAC REHAB PHASE I   PRE:  Rate/Rhythm: 68 SR  BP:  Sitting: 172/95      SaO2: 96 RA  MODE:  Ambulation: 470 ft   POST:  Rate/Rhythm: 70 SR   BP:  Sitting: 189/115      SaO2: 98 RA   Pt ambulated in hall independently, tolerated well with no CP or SOB. Returned to bed with call bell and bedside table in reach. Informed RN of post ambulation BP. Dr Burt Knack into see pt and also noted BP. Post MI/stent education including site care, restrictions, risk factors, exercise guidelines, MI booklet, heart healthy diabetic diet, antiplatelet therapy importance and CRP2. All questions and concerns addressed. Will refer to [facility] for CRP2. Plan for possible discharge home today.   [id number]  Vanessa Barbara, RN BSN 09/20/2022 10:43 AM

## 2022-09-20 NOTE — Telephone Encounter (Addendum)
   Attention TOC pool,  This patient will need a TOC phone call after discharge. They are being discharged today likely. Follow-up appointment has already been arranged with: me on 1/26 They are a patient of Sherren Mocha, MD.  Would also check in to ensure BP better controlled at time of call. Had advised pt to notify if SBP still running >130 at home.  Thank you! Charlie Pitter, PA-C

## 2022-09-20 NOTE — Progress Notes (Addendum)
Progress Note  Patient Name: Blake Smith Date of Encounter: 09/20/2022  Primary Cardiologist: Sherren Mocha, MD  Subjective   Feeling well today. No CP or SOB. Ambulated without difficulty.  IV NTG titrated off. BP elevated.  Inpatient Medications    Scheduled Meds:  aspirin  81 mg Oral Daily   atorvastatin  80 mg Oral Daily   fentaNYL  25 mcg Intravenous Once   insulin aspart  0-15 Units Subcutaneous TID WC   insulin aspart  0-5 Units Subcutaneous QHS   metoprolol tartrate  25 mg Oral BID   sodium chloride flush  3 mL Intravenous Q12H   sodium chloride flush  3 mL Intravenous Q12H   ticagrelor  90 mg Oral BID   Continuous Infusions:  sodium chloride     nitroGLYCERIN Stopped (09/20/22 0714)   PRN Meds: sodium chloride, acetaminophen, acetaminophen, morphine injection, nitroGLYCERIN, ondansetron (ZOFRAN) IV, ondansetron (ZOFRAN) IV, sodium chloride flush   Vital Signs    Vitals:   09/19/22 2005 09/19/22 2025 09/20/22 0511 09/20/22 0849  BP: (!) 148/81  (!) 153/97 (!) 172/95  Pulse: 71 70 77 74  Resp: 12 (!) 24 18   Temp:   98 F (36.7 C)   TempSrc:   Oral   SpO2: 98% 99% 98%   Weight:   106.4 kg   Height:        Intake/Output Summary (Last 24 hours) at 09/20/2022 0950 Last data filed at 09/20/2022 0514 Gross per 24 hour  Intake 1351.44 ml  Output 1100 ml  Net 251.44 ml      09/20/2022    5:11 AM 09/18/2022   11:10 PM 10/03/2021    3:23 PM  Last 3 Weights  Weight (lbs) 234 lb 8 oz 240 lb 240 lb 6.4 oz  Weight (kg) 106.369 kg 108.863 kg 109.045 kg     Telemetry    NSR - Personally Reviewed  ECG    NSR 61bpm, nonspecific TW changes - Personally Reviewed  Physical Exam   GEN: No acute distress.  HEENT: Normocephalic, atraumatic, sclera non-icteric. Neck: No JVD or bruits. Cardiac: RRR no murmurs, rubs, or gallops.  Respiratory: Clear to auscultation bilaterally. Breathing is unlabored. GI: Soft, nontender, non-distended, BS +x 4. MS: no  deformity. Extremities: No clubbing or cyanosis. No edema. Distal pedal pulses are 2+ and equal bilaterally. Right radial cath site without hematoma or ecchymosis; good pulse. Neuro:  AAOx3. Follows commands. Psych:  Responds to questions appropriately with a normal affect.  Labs    High Sensitivity Troponin:   Recent Labs  Lab 09/18/22 2311 09/19/22 0126  TROPONINIHS 48* 83*      Cardiac EnzymesNo results for input(s): "TROPONINI" in the last 168 hours. No results for input(s): "TROPIPOC" in the last 168 hours.   Chemistry Recent Labs  Lab 09/18/22 2311 09/19/22 0507 09/20/22 0236  NA 133* 132* 132*  K 3.3* 3.4* 3.6  CL 99 98 99  CO2 '23 24 24  '$ GLUCOSE 375* 301* 237*  BUN '9 8 7  '$ CREATININE 0.83 0.78 0.77  CALCIUM 9.4 9.3 8.9  GFRNONAA >60 >60 >60  ANIONGAP '11 10 9     '$ Hematology Recent Labs  Lab 09/18/22 2311 09/20/22 0236  WBC 7.2 9.9  RBC 4.92 4.25  HGB 16.0 14.0  HCT 45.3 37.9*  MCV 92.1 89.2  MCH 32.5 32.9  MCHC 35.3 36.9*  RDW 11.6 11.6  PLT 233 175    BNPNo results for input(s): "BNP", "PROBNP" in the last  168 hours.   DDimer No results for input(s): "DDIMER" in the last 168 hours.   Radiology    CARDIAC CATHETERIZATION  Result Date: 09/19/2022 Images from the original result were not included.   Lat 1st Diag lesion is 99% stenosed.   A drug-eluting stent was successfully placed using a STENT ONYX FRONTIER 3.5X18.   Post intervention, there is a 0% residual stenosis. Blake Smith is a 62 y.o. male  [id number] LOCATION:  FACILITY: [facility] PHYSICIAN: Quay Burow, M.D. 1960-09-30 DATE OF PROCEDURE:  09/19/2022 DATE OF DISCHARGE: CARDIAC CATHETERIZATION / PCI DES Diag History obtained from chart review.62 y.o. male with HTN, HLD, DM2 on metformin who was seen 09/19/2022 for the evaluation of chest pain.  His troponins were positive/non-STEMI.  He presents now for diagnostic coronary angiography. PROCEDURE DESCRIPTION: The patient was brought to the second  floor [facility] cath lab in the postabsorptive state. He was premedicated with IV Versed and fentanyl. His right wrist was prepped and shaved in usual sterile fashion. Xylocaine 1% was used for local anesthesia. A 6 French sheath was inserted into the right radial artery using standard Seldinger technique. The patient received 13,000 units  of heparin intravenously.  A 5 [place] TIG catheter, right left Judkins diagnostic catheters were used for selective coronary angiography and obtain left heart pressures.  Isovue dye is used for the entirety of the case (total contrast the patient 150 cc).  Retrograde aortic, ventricular and pullback pressures were recorded.  Radial cocktail was administered via the SideArm sheath. The ACT was measured at 358.  Patient received Brilinta 180 mg p.o. loading dose.  Using a 6 [place] XB LAD 3.0 cm guide catheter along with a 0.14 Prowater guidewire I was able to cross the inferolateral branch of the large first diagonal branch with little difficulty.  Isovue dye is used for the entirety of the intervention.  Retroaortic pressures monitored in the case. I predilated the lesion with a 2.0 x 12 mm long balloon.  I then stented across the bifurcation into the inferior branch of the diagonal bifurcation with a 3.5 mm x 18 mm long Medtronic Onyx frontier drug-eluting stent deployed at 14 atm (3.60 mm) resulting reduction of a 99% stenosis to 0% residual.  The superior branch remained widely patent.  I elected not to postdilated because the stent appeared well opposed I did not want to risk losing the superior branch.   Blake Smith had a non-STEMI with a high-grade diagonal branch stenosis at the bifurcation successfully treated with a 3.5 x 18 mm long Medtronic Onyx frontier drug-eluting stent.  The remainder of his anatomy was free of significant disease.  He will need DAPT uninterrupted for 12 months including aspirin and Brilinta along with risk factor modification including  high-dose statin therapy and beta-blocker.  The sheath was removed and a TR band was placed on the right wrist to achieve patent hemostasis.  The patient left lab in stable condition.  I anticipate discharge tomorrow. Quay Burow. MD, [facility] 09/19/2022 4:21 PM    ECHOCARDIOGRAM COMPLETE  Result Date: 09/19/2022    ECHOCARDIOGRAM REPORT   Patient Name:   Blake Smith Date of Exam: 09/19/2022 Medical Rec #:  [id number]      Height:       68.0 in Accession #:    [id number]     Weight:       240.0 lb Date of Birth:  17-Mar-1961      BSA:  2.208 m Patient Age:    21 years       BP:           159/94 mmHg Patient Gender: M              HR:           65 bpm. Exam Location:  Inpatient Procedure: 2D Echo, Cardiac Doppler, Color Doppler and Intracardiac            Opacification Agent Indications:    NSTEMI  History:        Patient has no prior history of Echocardiogram examinations.                 Signs/Symptoms:Chest Pain; Risk Factors:Hypertension,                 Dyslipidemia and Diabetes.  Sonographer:    Eartha Inch Referring Phys: Silas Flood  Sonographer Comments: Technically difficult study due to poor echo windows. Image acquisition challenging due to patient body habitus and Image acquisition challenging due to respiratory motion. IMPRESSIONS  1. Left ventricular ejection fraction, by estimation, is 55 to 60%. The left ventricle has normal function. The left ventricle has no regional wall motion abnormalities. There is moderate hypertrophy of the basal septum. The rest of the LV segments demonstrate mild left ventricular hypertrophy. Left ventricular diastolic parameters are consistent with Grade I diastolic dysfunction (impaired relaxation).  2. Right ventricular systolic function is normal. The right ventricular size is normal. Tricuspid regurgitation signal is inadequate for assessing PA pressure.  3. The mitral valve is normal in structure. Trivial mitral valve regurgitation.  4. The aortic  valve is tricuspid. Aortic valve regurgitation is not visualized. No aortic stenosis is present. Comparison(s): No prior Echocardiogram. FINDINGS  Left Ventricle: Left ventricular ejection fraction, by estimation, is 55 to 60%. The left ventricle has normal function. The left ventricle has no regional wall motion abnormalities. The left ventricular internal cavity size was normal in size. There is  moderate hypertrophy of the basal septum. The rest of the LV segments demonstrate mild left ventricular hypertrophy. Left ventricular diastolic parameters are consistent with Grade I diastolic dysfunction (impaired relaxation). Right Ventricle: The right ventricular size is normal. No increase in right ventricular wall thickness. Right ventricular systolic function is normal. Tricuspid regurgitation signal is inadequate for assessing PA pressure. Left Atrium: Left atrial size was normal in size. Right Atrium: Right atrial size was normal in size. Pericardium: There is no evidence of pericardial effusion. Mitral Valve: The mitral valve is normal in structure. Trivial mitral valve regurgitation. MV peak gradient, 2.0 mmHg. The mean mitral valve gradient is 1.0 mmHg. Tricuspid Valve: The tricuspid valve is normal in structure. Tricuspid valve regurgitation is trivial. Aortic Valve: The aortic valve is tricuspid. Aortic valve regurgitation is not visualized. No aortic stenosis is present. Pulmonic Valve: The pulmonic valve was normal in structure. Pulmonic valve regurgitation is trivial. Aorta: The aortic root and ascending aorta are structurally normal, with no evidence of dilitation. Venous: The inferior vena cava was not well visualized. IAS/Shunts: The atrial septum is grossly normal.  LEFT VENTRICLE PLAX 2D LVIDd:         4.00 cm      Diastology LVIDs:         3.10 cm      LV e' medial:    5.44 cm/s LV PW:         1.40 cm      LV E/e' medial:  8.9  LV IVS:        1.30 cm      LV e' lateral:   6.53 cm/s LVOT diam:     2.00  cm      LV E/e' lateral: 7.4 LV SV:         71 LV SV Index:   32 LVOT Area:     3.14 cm  LV Volumes (MOD) LV vol d, MOD A2C: 134.0 ml LV vol d, MOD A4C: 144.0 ml LV vol s, MOD A2C: 42.1 ml LV vol s, MOD A4C: 59.4 ml LV SV MOD A2C:     91.9 ml LV SV MOD A4C:     144.0 ml LV SV MOD BP:      90.0 ml RIGHT VENTRICLE RV S prime:     10.20 cm/s TAPSE (M-mode): 2.0 cm LEFT ATRIUM           Index        RIGHT ATRIUM           Index LA diam:      3.70 cm 1.68 cm/m   RA Area:     10.50 cm LA Vol (A2C): 50.9 ml 23.05 ml/m  RA Volume:   17.40 ml  7.88 ml/m LA Vol (A4C): 44.8 ml 20.29 ml/m  AORTIC VALVE LVOT Vmax:   102.00 cm/s LVOT Vmean:  74.400 cm/s LVOT VTI:    0.225 m  AORTA Ao Root diam: 3.20 cm Ao Asc diam:  3.50 cm MITRAL VALVE MV Area (PHT): 3.08 cm    SHUNTS MV Area VTI:   2.91 cm    Systemic VTI:  0.22 m MV Peak grad:  2.0 mmHg    Systemic Diam: 2.00 cm MV Mean grad:  1.0 mmHg MV Vmax:       0.70 m/s MV Vmean:      46.2 cm/s MV Decel Time: 246 msec MV E velocity: 48.30 cm/s MV A velocity: 74.40 cm/s MV E/A ratio:  0.65 Gwyndolyn Kaufman MD Electronically signed by Gwyndolyn Kaufman MD Signature Date/Time: 09/19/2022/1:09:48 PM    Final    DG Chest 2 View  Result Date: 09/18/2022 CLINICAL DATA:  Chest pain. EXAM: CHEST - 2 VIEW COMPARISON:  November 28, 2012 FINDINGS: The heart size and mediastinal contours are within normal limits. Low lung volumes are noted. Both lungs are clear. The visualized skeletal structures are unremarkable. IMPRESSION: No active cardiopulmonary disease. Electronically Signed   By: Virgina Norfolk M.D.   On: 09/18/2022 23:36    Cardiac Studies   LHC 09/19/22     Lat 1st Diag lesion is 99% stenosed.   A drug-eluting stent was successfully placed using a STENT ONYX FRONTIER 3.5X18.   Post intervention, there is a 0% residual stenosis.  PROCEDURE DESCRIPTION:    The patient was brought to the second floor [facility]iac cath lab in the postabsorptive state. He was  premedicated with IV Versed and fentanyl. His right wrist was prepped and shaved in usual sterile fashion. Xylocaine 1% was used for local anesthesia. A 6 French sheath was inserted into the right radial artery using standard Seldinger technique. The patient received 13,000 units  of heparin intravenously.  A 5 [place] TIG catheter, right left Judkins diagnostic catheters were used for selective coronary angiography and obtain left heart pressures.  Isovue dye is used for the entirety of the case (total contrast the patient 150 cc).  Retrograde aortic, ventricular and pullback pressures were recorded.  Radial cocktail was administered via  the SideArm sheath.   The ACT was measured at 358.  Patient received Brilinta 180 mg p.o. loading dose.  Using a 6 [place] XB LAD 3.0 cm guide catheter along with a 0.14 Prowater guidewire I was able to cross the inferolateral branch of the large first diagonal branch with little difficulty.  Isovue dye is used for the entirety of the intervention.  Retroaortic pressures monitored in the case.   I predilated the lesion with a 2.0 x 12 mm long balloon.  I then stented across the bifurcation into the inferior branch of the diagonal bifurcation with a 3.5 mm x 18 mm long Medtronic Onyx frontier drug-eluting stent deployed at 14 atm (3.60 mm) resulting reduction of a 99% stenosis to 0% residual.  The superior branch remained widely patent.  I elected not to postdilated because the stent appeared well opposed I did not want to risk losing the superior branch.     IMPRESSION: Mr. Seever had a non-STEMI with a high-grade diagonal branch stenosis at the bifurcation successfully treated with a 3.5 x 18 mm long Medtronic Onyx frontier drug-eluting stent.  The remainder of his anatomy was free of significant disease.  He will need DAPT uninterrupted for 12 months including aspirin and Brilinta along with risk factor modification including high-dose statin therapy and beta-blocker.  The  sheath was removed and a TR band was placed on the right wrist to achieve patent hemostasis.  The patient left lab in stable condition.  I anticipate discharge tomorrow.   Quay Burow. MD, [facility] 09/19/2022 4:21 PM  2D echo 09/19/22   1. Left ventricular ejection fraction, by estimation, is 55 to 60%. The  left ventricle has normal function. The left ventricle has no regional  wall motion abnormalities. There is moderate hypertrophy of the basal  septum. The rest of the LV segments  demonstrate mild left ventricular hypertrophy. Left ventricular diastolic  parameters are consistent with Grade I diastolic dysfunction (impaired  relaxation).   2. Right ventricular systolic function is normal. The right ventricular  size is normal. Tricuspid regurgitation signal is inadequate for assessing  PA pressure.   3. The mitral valve is normal in structure. Trivial mitral valve  regurgitation.   4. The aortic valve is tricuspid. Aortic valve regurgitation is not  visualized. No aortic stenosis is present.   Comparison(s): No prior Echocardiogram.    Patient Profile     62 y.o. male with HTN, HLD, DM2 (had been off meds for a while), obesity who presented to [facility] with chest pain and NSTEMI.  Assessment & Plan    1. NSTEMI with CAD - echo EF 55-60%, moderate hypertrophy of basal septum, G1DD - 99% Lat 1st diag tx with DES  - now on ASA, Brilinta, atorvastatin  2. Essential HTN - reports being on blood pressure medicine in the past but has essentially been off all medications for quite some time as he had no longer needed them - BP remains elevated - per d/w MD, will switch metoprolol to carvedilol 6.25 BID and start losartan '50mg'$  daily  3. Hyperlipidemia - LDL 136, HDL 48 - Started on atorvastatin 80 mg daily - If the patient is tolerating statin at time of follow-up appointment, would consider rechecking liver function/lipid panel in 6-8 weeks  4. Diabetes mellitus - A1C 8.8 - prior  intolerance to metformin with diarrhea - we will start SGLT2i at DC with close f/u PCP to discuss next options  5. Hypokalemia - improved - Na 132  but corrects to 135 for glucose   For questions or updates, please contact [place] Please consult [email or web address] for contact info under Cardiology/STEMI.  Signed, Charlie Pitter, PA-C 09/20/2022, 9:50 AM    Patient seen, examined. Available data reviewed. Agree with findings, assessment, and plan as outlined by Melina Copa, PA-C.  The patient is independently interviewed and examined.  He is alert, oriented, no distress.  HEENT is normal, lungs are clear bilaterally, heart is regular rate and rhythm no murmur gallop, abdomen soft nontender no masses, extremities with no edema, right radial site is clear with mild ecchymoses but no firm hematoma.  The patient has done very well with PCI of critical stenosis in a large diagonal branch of the LAD.  I agree with his medical program as outlined above.  I think he has probably had severe hypertension that has been untreated.  When he was initially seen, his blood pressure was 198/114.  He has been started on losartan and carvedilol, but blood pressure remains elevated.  Recommend adding amlodipine 5 mg daily, dosing now, and otherwise he appears medically stable for discharge today.  We discussed issues around return to work, medication compliance, and post MI restrictions.  Follow-up is arranged as outlined above.  Sherren Mocha, M.D. 09/20/2022 1:06 PM

## 2022-09-21 LAB — LIPOPROTEIN A (LPA): Lipoprotein (a): 29.1 nmol/L (ref <75.0)

## 2022-09-23 NOTE — Telephone Encounter (Signed)
**Note De-Identified [name] Obfuscation** Transition Care Management Unsuccessful Follow-up Telephone Call  Date of discharge and from where:  09/20/2022 from [facility]  Attempts:  1st Attempt  Reason for unsuccessful TCM follow-up call:  Left voice message

## 2022-09-24 NOTE — Telephone Encounter (Signed)
**Note De-Identified Davarion Cuffee Obfuscation** Transition Care Management Unsuccessful Follow-up Telephone Call  Date of discharge and from where:  09/20/2022 From [facility]  Attempts:  2nd Attempt  Reason for unsuccessful TCM follow-up call:  No answer/busy I did leave a message on the pts VM ([place] per [facility]) asking him to call Jeani Hawking back at [facility] at [phone] concerning his recent discharge from the [facility] hospital. I also left a reminder in the message that he has a post hospital f/u scheduled with Dayna Dunn-PA-c on 10/04/2022 @ 2:20 (arrive 15 mins early) at [facility] at [address], [address] in [place].

## 2022-09-25 NOTE — Telephone Encounter (Signed)
**Note De-Identified Jannessa Ogden Obfuscation** Transition Care Management Unsuccessful Follow-up Telephone Call  Date of discharge and from where:  09/20/2022 from [facility]  Attempts:  3rd Attempt  Reason for unsuccessful TCM follow-up call:  Left voice message I left a message on the pts VM ([place] per [facility]) asking him to call Jeani Hawking back at [facility] at [phone] concerning his recent discharge from the [facility] hospital. I also left a reminder in the message that he has a post hospital f/u scheduled with Dayna Dunn-PA-c on 10/04/2022 @ 2:20 (arrive 15 mins early) at [facility] at [address], [address] in [place].

## 2022-10-01 ENCOUNTER — Telehealth (HOSPITAL_COMMUNITY): Payer: Self-pay

## 2022-10-01 NOTE — Telephone Encounter (Signed)
Pt insurance is active and benefits verified through [employer]. Co-pay $25.00, DED $500.00/$427.11 met, out of pocket $5,000.00/$580.25 met, co-insurance 0%. No pre-authorization required. Passport, 10/01/22 @ 1:32PM, CEQ#[id number]   How many CR sessions are covered? (36 sessions for TCR, 72 sessions for ICR)36 Is this a lifetime maximum or an annual maximum? Lifetime Has the member used any of these services to date? No Is there a time limit (weeks/months) on start of program and/or program completion? No     Will contact patient to see if he is interested in the Cardiac Rehab Program. If interested, patient will need to complete follow up appt. Once completed, patient will be contacted for scheduling upon review by the RN Navigator.

## 2022-10-01 NOTE — Telephone Encounter (Signed)
Attempted to call patient in regards to Cardiac Rehab - LM on VM 

## 2022-10-03 ENCOUNTER — Encounter: Payer: Self-pay | Admitting: Physician Assistant

## 2022-10-03 NOTE — Progress Notes (Signed)
Cardiology Office Note    Date:  10/04/2022   ID:  Blake Smith, DOB January 01, 1961, MRN [id number]  PCP:  No primary care provider on file.  Cardiologist:  Sherren Mocha, MD  Electrophysiologist:  None   Chief Complaint: f/u NSTEMI  History of Present Illness:   Blake Smith is a 62 y.o. male Licensed conveyancer chief with HTN, HLD, DM2, obesity, recent NSTEMI/CAD s/p DES to lat D1, LVH seen for post-hospital f/u. Prior to recent admission had been off of his medicines, with prior history of running out in 2021 as well with extremely high outpatient BP readings. He was admitted with CP/NSTEMI 09/19/22 and found to have high-grade diagonal branch stenosis at the bifurcation tx with DES. Echo showed EF 55-60%, moderate hypertrophy of basal septum, G1DD. He was started on standard post MI therapy, SGLT2i, and antihypertensives as well. Metformin not restarted due to prior h/o diarrhea/intolerance. Dr. Burt Knack recommended he remain on light duty until 1/19.  He is seen for follow-up today doing well without any complaints. No interim CP, SOB, palpitations, bleeding or concerns. He is not sure if he wants to participate in cardiac rehab yet. He inquires about potentially being on light duty at work for 3 months because if he gets included in a call he will have to wear up 140 lbs of gear. While on light duty he has a supervisory role without lifting or pushing. He recalls Dr. Burt Knack advising to remain out of more strenuous activity for 3 months. He is working on getting in with a PCP near [facility] as this is closer for him. He reports his medications were successfully transferred to his regular pharmacy already.   Labwork independently reviewed: 09/20/22 albumin 3.4, AST 45, ALT OK, K 3.6, Cr 0.77, Na, glucose 237, Hgb 14.0, plt 175, A1c 8.8, TSH 4.114, LDL 136, trig 255, Mg 1.9   Past History   Past Medical History:  Diagnosis Date   Allergy    Bone spur    dorssal distal  R foot   CAD in native  artery 09/20/2022   Erectile dysfunction    GERD (gastroesophageal reflux disease)    HTN (hypertension) 08/21/2012   Hyperlipidemia LDL goal <70 09/20/2022   Hypertension    Non-STEMI (non-ST elevated myocardial infarction) ([place]) 09/19/2022   Type 2 diabetes mellitus with other circulatory complications ([place]) 75/06/2584    Past Surgical History:  Procedure Laterality Date   BICEPS TENDON REPAIR     CORONARY STENT INTERVENTION N/A 09/19/2022   Procedure: CORONARY STENT INTERVENTION;  Surgeon: Lorretta Harp, MD;  Location: [place] [facility];  Service: Cardiovascular;  Laterality: N/A;   LEFT HEART CATH AND CORONARY ANGIOGRAPHY N/A 09/19/2022   Procedure: LEFT HEART CATH AND CORONARY ANGIOGRAPHY;  Surgeon: Lorretta Harp, MD;  Location: [place] [facility];  Service: Cardiovascular;  Laterality: N/A;   ROTATOR CUFF REPAIR     bilateral   TONSILLECTOMY AND ADENOIDECTOMY     undescended testicle     lesion penis removed  62 y/o    Current Medications: Current Meds  Medication Sig   Accu-Chek Softclix Lancets lancets Use as directed   amLODipine (NORVASC) 5 MG tablet Take 1 tablet (5 mg total) by mouth daily.   aspirin EC 81 MG tablet Take 1 tablet (81 mg total) by mouth daily. Swallow whole.   atorvastatin (LIPITOR) 80 MG tablet Take 1 tablet (80 mg total) by mouth daily.   blood glucose meter kit and  supplies Dispense based on patient and insurance preference. Use up to four times daily as directed. (FOR ICD-10 E10.9, E11.9).   carvedilol (COREG) 6.25 MG tablet Take 1 tablet (6.25 mg total) by mouth 2 (two) times daily with a meal.   cetirizine (ZYRTEC) 10 MG tablet Take 10 mg by mouth daily.   empagliflozin (JARDIANCE) 10 MG TABS tablet Take 1 tablet (10 mg total) by mouth daily.   glucose blood test strip Use as directed   lansoprazole (PREVACID SOLUTAB) 15 MG disintegrating tablet Take 15 mg by mouth daily.   losartan (COZAAR) 50 MG tablet Take 1 tablet (50 mg total) by mouth  daily.   nitroGLYCERIN (NITROSTAT) 0.4 MG SL tablet Place 1 tablet (0.4 mg total) under the tongue every 5 (five) minutes as needed for chest pain (up to 3 doses. If taking 3rd dose, call 911).   sildenafil (VIAGRA) 100 MG tablet Take 0.5-1 tablets (50-100 mg total) by mouth daily as needed for erectile dysfunction.   ticagrelor (BRILINTA) 90 MG TABS tablet Take 1 tablet (90 mg total) by mouth 2 (two) times daily.     Allergies:   Metformin and related   Social History   Socioeconomic History   Marital status: Married    Spouse name: Not on file   Number of children: Not on file   Years of education: Not on file   Highest education level: Not on file  Occupational History   Occupation: Chemical engineer: UNEMPLOYED  Tobacco Use   Smoking status: Never   Smokeless tobacco: Current    Types: Snuff  Vaping Use   Vaping Use: Never used  Substance and Sexual Activity   Alcohol use: Yes    Alcohol/week: 1.0 standard drink of alcohol    Types: 1 Standard drinks or equivalent per week    Comment: seldom - rare    Drug use: No   Sexual activity: Yes  Other Topics Concern   Not on file  Social History Narrative   Not on file   Social Determinants of Health   Financial Resource Strain: Not on file  Food Insecurity: No Food Insecurity (09/19/2022)   Hunger Vital Sign    Worried About Running Out of Food in the Last Year: Never true    Ran Out of Food in the Last Year: Never true  Transportation Needs: No Transportation Needs (09/19/2022)   PRAPARE - Hydrologist (Medical): No    Lack of Transportation (Non-Medical): No  Physical Activity: Not on file  Stress: Not on file  Social Connections: Not on file     Family History:  The patient's family history includes Diabetes in his father; Hypertension in his father; Stroke (age of onset: 67) in his father. There is no history of Colon cancer, Colon polyps, Esophageal cancer, Rectal cancer, or  Stomach cancer.  ROS:   Please see the history of present illness.  All other systems are reviewed and otherwise negative.    EKG(s)/Additional Testing   EKG:  EKG is ordered today, personally reviewed, demonstrating NSR 70bpm, nonspecific STTW changes  CV Studies: Cardiac studies reviewed are outlined and summarized above. Otherwise please see EMR for full report.  Recent Labs: 09/19/2022: Magnesium 1.9; TSH 4.114 09/20/2022: ALT 36; BUN 7; Creatinine, Ser 0.77; Hemoglobin 14.0; Platelets 175; Potassium 3.6; Sodium 132  Recent Lipid Panel    Component Value Date/Time   CHOL 235 (H) 09/19/2022 0542   CHOL 248 (H) 12/31/2019  1250   TRIG 255 (H) 09/19/2022 0542   HDL 48 09/19/2022 0542   HDL 65 12/31/2019 1250   CHOLHDL 4.9 09/19/2022 0542   VLDL 51 (H) 09/19/2022 0542   LDLCALC 136 (H) 09/19/2022 0542   LDLCALC 151 (H) 12/31/2019 1250    PHYSICAL EXAM:    VS:  BP 114/70   Pulse 70   Ht '5\' 7"'$  (1.702 m)   Wt 227 lb (103 kg)   SpO2 99%   BMI 35.55 kg/m   BMI: Body mass index is 35.55 kg/m.  GEN: Well nourished, well developed male in no acute distress HEENT: normocephalic, atraumatic Neck: no JVD, carotid bruits, or masses Cardiac: RRR; no murmurs, rubs, or gallops, no edema  Respiratory:  clear to auscultation bilaterally, normal work of breathing GI: soft, nontender, nondistended, + BS MS: no deformity or atrophy Skin: warm and dry, no rash, right radial cath site without hematoma or ecchymosis; good pulse. Neuro:  Alert and Oriented x 3, Strength and sensation are intact, follows commands Psych: euthymic mood, full affect  Wt Readings from Last 3 Encounters:  10/04/22 227 lb (103 kg)  09/20/22 234 lb 8 oz (106.4 kg)  10/03/21 240 lb 6.4 oz (109 kg)     ASSESSMENT & PLAN:   1. CAD with recent NSTEMI - clinically doing well without recurrent angina. Discussed risk factor modification and adherence to meds including DAPT. Continue current regimen which includes  ASA, Brilinta, carvedilol and atorvastatin. We had previously relayed instruction regarding avoidance of sildenafil with SL NTG and vice versa with prior instructions. Per d/w Dr. Burt Knack, [place] to write work note to remain on light duty x 3 months. Note provided today.  2. HTN with LVH - much better control on present regimen - continue losartan, carvedilol and amlodipine today. Check K, creatinine today.  3. Hyperlipidemia - will recheck CMET today given mildly abnormal AST as well as new start losartan. Otherwise recheck fasting lipids/CMET in 2-3 months.  4. DM2 uncontrolled - encouraged f/u with PCP , he is arranging f/u with a provider on [facility]. I told him to let us know if he needs any assistance with referral. Checking CMET today as above, continue SGLT2i and ARB.    Cardiac Rehabilitation Eligibility Assessment  The patient is ready to start cardiac rehabilitation from a cardiac standpoint.    Disposition: F/u with Dr. Burt Knack or APP in 4 months.   Medication Adjustments/Labs and Tests Ordered: Current medicines are reviewed at length with the patient today.  Concerns regarding medicines are outlined above. Medication changes, Labs and Tests ordered today are summarized above and listed in the Patient Instructions accessible in Encounters. TOC not billed due to phone call not returned.  Signed, Charlie Pitter, PA-C  10/04/2022 2:34 PM    [place] Phone: [phone]; Fax: [phone]

## 2022-10-04 ENCOUNTER — Encounter: Payer: Self-pay | Admitting: *Deleted

## 2022-10-04 ENCOUNTER — Ambulatory Visit: Payer: 59 | Attending: Physician Assistant | Admitting: Physician Assistant

## 2022-10-04 ENCOUNTER — Encounter: Payer: Self-pay | Admitting: Physician Assistant

## 2022-10-04 VITALS — BP 114/70 | HR 70 | Ht 67.0 in | Wt 227.0 lb

## 2022-10-04 DIAGNOSIS — I517 Cardiomegaly: Secondary | ICD-10-CM

## 2022-10-04 DIAGNOSIS — I10 Essential (primary) hypertension: Secondary | ICD-10-CM

## 2022-10-04 DIAGNOSIS — I2510 Atherosclerotic heart disease of native coronary artery without angina pectoris: Secondary | ICD-10-CM

## 2022-10-04 DIAGNOSIS — I214 Non-ST elevation (NSTEMI) myocardial infarction: Secondary | ICD-10-CM

## 2022-10-04 DIAGNOSIS — E785 Hyperlipidemia, unspecified: Secondary | ICD-10-CM

## 2022-10-04 DIAGNOSIS — E1165 Type 2 diabetes mellitus with hyperglycemia: Secondary | ICD-10-CM

## 2022-10-04 NOTE — Patient Instructions (Signed)
Medication Instructions:  Your physician recommends that you continue on your current medications as directed. Please refer to the Current Medication list given to you today.  *If you need a refill on your cardiac medications before your next appointment, please call your pharmacy*   Lab Work: CMET today CMET and Lipids in 2 months If you have labs (blood work) drawn today and your tests are completely normal, you will receive your results only by: [place] (if you have MyChart) OR A paper copy in the mail If you have any lab test that is abnormal or we need to change your treatment, we will call you to review the results.   Follow-Up: At [facility], you and your health needs are our priority.  As part of our continuing mission to provide you with exceptional heart care, we have created designated Provider Care Teams.  These Care Teams include your primary Cardiologist (physician) and Advanced Practice Providers (APPs -  Physician Assistants and Nurse Practitioners) who all work together to provide you with the care you need, when you need it.   Your next appointment:   4 month(s)  Provider:   Sherren Mocha, MD  or Melina Copa, PA-C

## 2022-10-05 LAB — COMPREHENSIVE METABOLIC PANEL
ALT: 33 IU/L (ref 0–44)
AST: 28 IU/L (ref 0–40)
Albumin/Globulin Ratio: 1.8 (ref 1.2–2.2)
Albumin: 4.6 g/dL (ref 3.8–4.9)
Alkaline Phosphatase: 100 IU/L (ref 44–121)
BUN/Creatinine Ratio: 15 (ref 9–20)
BUN: 13 mg/dL (ref 6–24)
Bilirubin Total: 0.5 mg/dL (ref 0.0–1.2)
CO2: 21 mmol/L (ref 20–29)
Calcium: 10.1 mg/dL (ref 8.7–10.2)
Chloride: 100 mmol/L (ref 96–106)
Creatinine, Ser: 0.85 mg/dL (ref 0.76–1.27)
Globulin, Total: 2.5 g/dL (ref 1.5–4.5)
Glucose: 206 mg/dL — ABNORMAL HIGH (ref 70–99)
Potassium: 4.1 mmol/L (ref 3.5–5.2)
Sodium: 139 mmol/L (ref 134–144)
Total Protein: 7.1 g/dL (ref 6.0–8.5)
eGFR: 100 mL/min/{1.73_m2} (ref >59)

## 2022-10-08 ENCOUNTER — Telehealth: Payer: Self-pay

## 2022-10-08 NOTE — Telephone Encounter (Signed)
The patient has been notified of the result and verbalized understanding.  All questions (if any) were answered. Gershon Crane, LPN 8/71/9941 29:04 PM

## 2022-10-08 NOTE — Telephone Encounter (Signed)
-----  Message from Charlie Pitter, [place] sent at 10/08/2022 10:49 AM EST ----- I sent this msg to MyChart several days ago but was notified pt had not reviewed it yet so please call him to let him know -  "Hi Mr. Chapa! Just wanted to let you know your follow-u plabs looked great. Liver function has normalized. The only abnormality is the elevated blood sugar so definitely keep that plan to follow up with your primary care provider as soon as you can to discuss further treatment. Please let us know if there are any significant delays with this."

## 2022-10-16 ENCOUNTER — Other Ambulatory Visit: Payer: Self-pay

## 2022-10-16 MED ORDER — CARVEDILOL 6.25 MG PO TABS: 6.2500 mg | ORAL_TABLET | Freq: Two times a day (BID) | ORAL | 3 refills | Status: DC

## 2022-10-16 MED ORDER — AMLODIPINE BESYLATE 5 MG PO TABS: 5.0000 mg | ORAL_TABLET | Freq: Every day | ORAL | 3 refills | Status: DC

## 2022-10-16 MED ORDER — ASPIRIN 81 MG PO TBEC: 81.0000 mg | DELAYED_RELEASE_TABLET | Freq: Every day | ORAL | 3 refills | Status: DC

## 2022-10-16 MED ORDER — ATORVASTATIN CALCIUM 80 MG PO TABS: 80.0000 mg | ORAL_TABLET | Freq: Every day | ORAL | 3 refills | Status: DC

## 2022-10-16 MED ORDER — LOSARTAN POTASSIUM 50 MG PO TABS: 50.0000 mg | ORAL_TABLET | Freq: Every day | ORAL | 3 refills | Status: DC

## 2022-10-16 MED ORDER — EMPAGLIFLOZIN 10 MG PO TABS: 10.0000 mg | ORAL_TABLET | Freq: Every day | ORAL | 3 refills | Status: DC

## 2022-10-16 NOTE — Telephone Encounter (Signed)
Pt's medications were sent to pt's pharmacy as requested. Confirmation received.  

## 2022-10-26 ENCOUNTER — Other Ambulatory Visit: Payer: Self-pay | Admitting: Family Medicine

## 2022-10-26 DIAGNOSIS — E119 Type 2 diabetes mellitus without complications: Secondary | ICD-10-CM

## 2022-10-31 ENCOUNTER — Encounter (HOSPITAL_COMMUNITY): Payer: Self-pay

## 2022-10-31 ENCOUNTER — Telehealth (HOSPITAL_COMMUNITY): Payer: Self-pay

## 2022-10-31 NOTE — Telephone Encounter (Signed)
Attempted to call patient in regards to Cardiac Rehab - LM on VM  Mailed letter 

## 2022-11-07 ENCOUNTER — Other Ambulatory Visit (HOSPITAL_COMMUNITY): Payer: Self-pay

## 2022-11-25 ENCOUNTER — Telehealth (HOSPITAL_COMMUNITY): Payer: Self-pay

## 2022-11-25 NOTE — Telephone Encounter (Signed)
No response from pt.  Closed referral  

## 2022-12-03 ENCOUNTER — Ambulatory Visit: Payer: 59 | Attending: Physician Assistant

## 2022-12-03 DIAGNOSIS — I214 Non-ST elevation (NSTEMI) myocardial infarction: Secondary | ICD-10-CM

## 2022-12-03 DIAGNOSIS — I2510 Atherosclerotic heart disease of native coronary artery without angina pectoris: Secondary | ICD-10-CM

## 2022-12-03 DIAGNOSIS — I10 Essential (primary) hypertension: Secondary | ICD-10-CM

## 2022-12-03 DIAGNOSIS — E785 Hyperlipidemia, unspecified: Secondary | ICD-10-CM

## 2022-12-03 DIAGNOSIS — I517 Cardiomegaly: Secondary | ICD-10-CM

## 2022-12-03 DIAGNOSIS — E1165 Type 2 diabetes mellitus with hyperglycemia: Secondary | ICD-10-CM

## 2022-12-04 LAB — COMPREHENSIVE METABOLIC PANEL
ALT: 18 IU/L (ref 0–44)
AST: 20 IU/L (ref 0–40)
Albumin/Globulin Ratio: 1.7 (ref 1.2–2.2)
Albumin: 4.5 g/dL (ref 3.8–4.9)
Alkaline Phosphatase: 112 IU/L (ref 44–121)
BUN/Creatinine Ratio: 16 (ref 9–20)
BUN: 15 mg/dL (ref 6–24)
Bilirubin Total: 0.8 mg/dL (ref 0.0–1.2)
CO2: 22 mmol/L (ref 20–29)
Calcium: 9.7 mg/dL (ref 8.7–10.2)
Chloride: 101 mmol/L (ref 96–106)
Creatinine, Ser: 0.91 mg/dL (ref 0.76–1.27)
Globulin, Total: 2.6 g/dL (ref 1.5–4.5)
Glucose: 137 mg/dL — ABNORMAL HIGH (ref 70–99)
Potassium: 4.1 mmol/L (ref 3.5–5.2)
Sodium: 141 mmol/L (ref 134–144)
Total Protein: 7.1 g/dL (ref 6.0–8.5)
eGFR: 97 mL/min/{1.73_m2} (ref >59)

## 2022-12-04 LAB — LIPID PANEL
Chol/HDL Ratio: 2.9 ratio (ref 0.0–5.0)
Cholesterol, Total: 167 mg/dL (ref 100–199)
HDL: 57 mg/dL (ref >39)
LDL Chol Calc (NIH): 74 mg/dL (ref 0–99)
Triglycerides: 217 mg/dL — ABNORMAL HIGH (ref 0–149)
VLDL Cholesterol Cal: 36 mg/dL (ref 5–40)

## 2022-12-10 ENCOUNTER — Telehealth: Payer: Self-pay

## 2022-12-10 ENCOUNTER — Telehealth: Payer: Self-pay | Admitting: Cardiovascular Disease

## 2022-12-10 DIAGNOSIS — Z79899 Other long term (current) drug therapy: Secondary | ICD-10-CM

## 2022-12-10 DIAGNOSIS — E785 Hyperlipidemia, unspecified: Secondary | ICD-10-CM

## 2022-12-10 MED ORDER — EZETIMIBE 10 MG PO TABS: 10.0000 mg | ORAL_TABLET | Freq: Every day | ORAL | 3 refills | Status: DC

## 2022-12-10 NOTE — Telephone Encounter (Signed)
Patient returned RN's call. 

## 2022-12-10 NOTE — Telephone Encounter (Signed)
Spoke with patient about results and Dayna Dunn's recommendation. Sent in Ezetimibe (Zetia) 10mg  prescription and scheduled repeat lab work (LFT and Lipids) on 02/10/23. No further questions or concerns at this time.   Charlie Pitter, PA-C 12/04/2022 10:40 AM EDT     Please let pt know labs look good overall. Blood sugar up but better than before. Cholesterol improving from before. However, LDL is 74 - so not quite at goal but very close. We recommend to get it <70. Most often we suggest initiation of ezetimibe 10mg  daily with f/u LFTs/fasting lipids in 8 weeks. However, if he feels there is room to work on his diet/exercise and does not wish to add any other medicines at this time, continue current regimen and recheck LFTs/fasting lipids in 8 weeks.

## 2022-12-11 NOTE — Telephone Encounter (Signed)
Addressed in other phone encounter from 12/10/22.

## 2023-02-05 ENCOUNTER — Ambulatory Visit: Payer: 59 | Admitting: Physician Assistant

## 2023-02-10 ENCOUNTER — Ambulatory Visit: Payer: 59 | Attending: Physician Assistant

## 2023-02-10 DIAGNOSIS — E785 Hyperlipidemia, unspecified: Secondary | ICD-10-CM

## 2023-02-10 DIAGNOSIS — Z79899 Other long term (current) drug therapy: Secondary | ICD-10-CM

## 2023-02-10 LAB — HEPATIC FUNCTION PANEL
ALT: 24 IU/L (ref 0–44)
AST: 27 IU/L (ref 0–40)
Albumin: 4.4 g/dL (ref 3.8–4.9)
Alkaline Phosphatase: 89 IU/L (ref 44–121)
Bilirubin Total: 0.5 mg/dL (ref 0.0–1.2)
Bilirubin, Direct: 0.16 mg/dL (ref 0.00–0.40)
Total Protein: 6.5 g/dL (ref 6.0–8.5)

## 2023-02-10 LAB — LIPID PANEL
Chol/HDL Ratio: 2.6 ratio (ref 0.0–5.0)
Cholesterol, Total: 131 mg/dL (ref 100–199)
HDL: 51 mg/dL (ref >39)
LDL Chol Calc (NIH): 50 mg/dL (ref 0–99)
Triglycerides: 183 mg/dL — ABNORMAL HIGH (ref 0–149)
VLDL Cholesterol Cal: 30 mg/dL (ref 5–40)

## 2023-02-25 ENCOUNTER — Telehealth: Payer: Self-pay

## 2023-02-25 ENCOUNTER — Other Ambulatory Visit: Payer: 59

## 2023-02-25 DIAGNOSIS — Z79899 Other long term (current) drug therapy: Secondary | ICD-10-CM

## 2023-02-25 DIAGNOSIS — E781 Pure hyperglyceridemia: Secondary | ICD-10-CM

## 2023-02-25 MED ORDER — ICOSAPENT ETHYL 1 G PO CAPS: 1.0000 g | ORAL_CAPSULE | Freq: Two times a day (BID) | ORAL | 3 refills | Status: DC

## 2023-02-25 NOTE — Telephone Encounter (Signed)
Dyann Kief, PA-C 02/10/2023  7:05 PM EDT     Covering for Dayna. LDL at goal and LFT"s normal. Triglycerides still high 183 but lower than 2 months ago. Continue to reduce sugars in diet. Can try vascepa 1 gm bid. Recheck flp in 3 months thanks   Patient returned call to office. Does want to try the Vascepa. Medication sent to pharmacy on file. Labs entered and scheduled for 05/29/23

## 2023-02-28 ENCOUNTER — Telehealth: Payer: Self-pay | Admitting: Cardiovascular Disease

## 2023-02-28 NOTE — Telephone Encounter (Signed)
  Per MyChart scheduling message:  Patient states that he is unable to get the icosapent Ethyl (VASCEPA) 1 g capsule that Herma Carson prescribed. The pharmacy told him that his insurance will not pay for it. Please advise patient.

## 2023-03-03 MED ORDER — FISH OIL 1000 MG PO CAPS: 1.0000 | ORAL_CAPSULE | Freq: Two times a day (BID) | ORAL | 0 refills | Status: AC

## 2023-03-03 NOTE — Telephone Encounter (Signed)
Patient called back. Informed him of Michele's advisement with taking fish oil. Patient agreed to plan. Will update patient's medication list.

## 2023-03-03 NOTE — Telephone Encounter (Signed)
Dyann Kief, PA-C  to Me     03/03/23  7:46 AM Patient can take fish oil 1000 mg twice a day instead. Thanks   Left message for patient to call back.

## 2023-05-29 ENCOUNTER — Ambulatory Visit: Payer: 59 | Attending: Physician Assistant

## 2023-05-29 DIAGNOSIS — E781 Pure hyperglyceridemia: Secondary | ICD-10-CM

## 2023-05-29 DIAGNOSIS — Z79899 Other long term (current) drug therapy: Secondary | ICD-10-CM

## 2023-05-30 LAB — HEPATIC FUNCTION PANEL
ALT: 27 IU/L (ref 0–44)
AST: 27 IU/L (ref 0–40)
Albumin: 4.6 g/dL (ref 3.8–4.9)
Alkaline Phosphatase: 79 IU/L (ref 44–121)
Bilirubin Total: 1 mg/dL (ref 0.0–1.2)
Bilirubin, Direct: 0.25 mg/dL (ref 0.00–0.40)
Total Protein: 6.8 g/dL (ref 6.0–8.5)

## 2023-05-31 LAB — LIPID PANEL W/O CHOL/HDL RATIO

## 2023-06-02 LAB — SPECIMEN STATUS REPORT

## 2023-06-02 LAB — LIPID PANEL W/O CHOL/HDL RATIO
Cholesterol, Total: 137 mg/dL (ref 100–199)
HDL: 50 mg/dL (ref >39)
LDL Chol Calc (NIH): 62 mg/dL (ref 0–99)
Triglycerides: 144 mg/dL (ref 0–149)
VLDL Cholesterol Cal: 25 mg/dL (ref 5–40)

## 2023-09-26 ENCOUNTER — Other Ambulatory Visit: Payer: Self-pay | Admitting: Physician Assistant

## 2023-10-03 ENCOUNTER — Other Ambulatory Visit: Payer: Self-pay | Admitting: Physician Assistant

## 2023-10-14 ENCOUNTER — Other Ambulatory Visit: Payer: Self-pay | Admitting: Physician Assistant

## 2023-10-14 DIAGNOSIS — E785 Hyperlipidemia, unspecified: Secondary | ICD-10-CM

## 2023-10-14 DIAGNOSIS — Z79899 Other long term (current) drug therapy: Secondary | ICD-10-CM

## 2023-10-16 ENCOUNTER — Other Ambulatory Visit: Payer: Self-pay | Admitting: Cardiovascular Disease

## 2023-11-14 ENCOUNTER — Other Ambulatory Visit: Payer: Self-pay | Admitting: Physician Assistant

## 2023-11-14 ENCOUNTER — Other Ambulatory Visit: Payer: Self-pay | Admitting: Cardiovascular Disease

## 2023-11-14 MED ORDER — AMLODIPINE BESYLATE 5 MG PO TABS: 5.0000 mg | ORAL_TABLET | Freq: Every day | ORAL | 0 refills | Status: DC

## 2023-11-14 MED ORDER — LOSARTAN POTASSIUM 50 MG PO TABS: 50.0000 mg | ORAL_TABLET | Freq: Every day | ORAL | 0 refills | Status: DC

## 2023-11-14 MED ORDER — CARVEDILOL 6.25 MG PO TABS: 6.2500 mg | ORAL_TABLET | Freq: Two times a day (BID) | ORAL | 0 refills | Status: DC

## 2023-11-14 MED ORDER — ATORVASTATIN CALCIUM 80 MG PO TABS: 80.0000 mg | ORAL_TABLET | Freq: Every day | ORAL | 0 refills | Status: DC

## 2023-12-16 ENCOUNTER — Other Ambulatory Visit (HOSPITAL_COMMUNITY): Payer: Self-pay

## 2023-12-17 ENCOUNTER — Other Ambulatory Visit: Payer: Self-pay | Admitting: Physician Assistant

## 2023-12-17 ENCOUNTER — Telehealth: Payer: Self-pay

## 2023-12-17 ENCOUNTER — Other Ambulatory Visit (HOSPITAL_COMMUNITY): Payer: Self-pay

## 2023-12-17 DIAGNOSIS — Z79899 Other long term (current) drug therapy: Secondary | ICD-10-CM

## 2023-12-17 DIAGNOSIS — E785 Hyperlipidemia, unspecified: Secondary | ICD-10-CM

## 2023-12-17 NOTE — Telephone Encounter (Signed)
 Pharmacy Patient Advocate Encounter   Received notification from CoverMyMeds that prior authorization for BRILINTA is required/requested.   Insurance verification completed.   The patient is insured through [facility] .   Per test claim: The current 30 day co-pay is, $50.  No PA needed at this time. This test claim was processed through [facility]- copay amounts may vary at other pharmacies due to pharmacy/plan contracts, or as the patient moves through the different stages of their insurance plan.

## 2023-12-31 ENCOUNTER — Encounter (HOSPITAL_COMMUNITY): Payer: Self-pay

## 2023-12-31 ENCOUNTER — Ambulatory Visit (HOSPITAL_COMMUNITY)
Admission: RE | Admit: 2023-12-31 | Discharge: 2023-12-31 | Disposition: A | Discharge status: Home / Self Care | Source: Ambulatory Visit | Attending: Family Medicine | Admitting: Family Medicine

## 2023-12-31 VITALS — BP 179/115 | HR 84 | Temp 98.1°F | Resp 16 | Ht 67.0 in | Wt 235.0 lb

## 2023-12-31 DIAGNOSIS — I10 Essential (primary) hypertension: Secondary | ICD-10-CM | POA: Diagnosis not present

## 2023-12-31 MED ORDER — ASPIRIN 81 MG PO CHEW: 81.0000 mg | CHEWABLE_TABLET | Freq: Every day | ORAL | 2 refills | Status: DC

## 2023-12-31 MED ORDER — ACYCLOVIR 400 MG PO TABS: 400.0000 mg | ORAL_TABLET | Freq: Three times a day (TID) | ORAL | 0 refills | Status: AC

## 2023-12-31 MED ORDER — LOSARTAN POTASSIUM 50 MG PO TABS: 50.0000 mg | ORAL_TABLET | Freq: Every day | ORAL | 2 refills | Status: DC

## 2023-12-31 NOTE — ED Triage Notes (Signed)
 Pt states that he needs his blood pressure medication refilled until his appt with his pcp in July.

## 2023-12-31 NOTE — ED Provider Notes (Signed)
 [facility]   [id number] 12/31/23 Arrival Time: 1609  ASSESSMENT & PLAN:  1. Elevated blood pressure reading with diagnosis of hypertension    BP (!) 179/115 (BP Location: Right Arm)   Pulse 84   Temp 98.1 F (36.7 C) (Oral)   Resp 16   Ht 5\' 7"  (1.702 m)   Wt 106.6 kg   SpO2 98%   BMI 36.81 kg/m  No symptoms related to elevated BP today.  Refilled at request.  Meds ordered this encounter  Medications   losartan  (COZAAR ) 50 MG tablet    Sig: Take 1 tablet (50 mg total) by mouth daily.    Dispense:  30 tablet    Refill:  2    Please call our office to schedule an overdue appointment with Dr. Arlester Ladd before anymore refills. [phone]. Thank you 2nd attempt   aspirin  81 MG chewable tablet    Sig: Chew 1 tablet (81 mg total) by mouth daily.    Dispense:  30 tablet    Refill:  2   acyclovir  (ZOVIRAX ) 400 MG tablet    Sig: Take 1 tablet (400 mg total) by mouth 3 (three) times daily.    Dispense:  50 tablet    Refill:  0    Has f/u appt in 7/25.  Reviewed expectations re: course of current medical issues. Questions answered. Outlined signs and symptoms indicating need for more acute intervention. Patient verbalized understanding. After Visit Summary given.   SUBJECTIVE:  Blake Smith is a 63 y.o. male who presents with concerns regarding increased blood pressures. Out of losartan  and ASA. Also would like Rx acyclovir  for recurrent cold sores.  Social History   Tobacco Use  Smoking Status Never  Smokeless Tobacco Current   Types: Snuff   OBJECTIVE:  Vitals:   12/31/23 1625 12/31/23 1626  BP:  (!) 179/115  Pulse:  84  Resp:  16  Temp:  98.1 F (36.7 C)  TempSrc:  Oral  SpO2:  98%  Weight: 106.6 kg   Height: 5\' 7"  (1.702 m)     General appearance: alert; no distres Skin: warm and dry Psychological: alert and cooperative; normal mood and affect  ECG:   Labs:  Labs Reviewed - No data to display  Imaging: No results  found.  Allergies  Allergen Reactions   Metformin  And Related Diarrhea    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   Social History   Socioeconomic History   Marital status: Married    Spouse name: Not on file   Number of children: Not on file   Years of education: Not on file   Highest education level: Not on file  Occupational History   Occupation: Careers information officer: UNEMPLOYED  Tobacco Use   Smoking status: Never   Smokeless tobacco: Current    Types: Snuff  Vaping Use   Vaping status: Never Used  Substance and Sexual Activity   Alcohol use: Yes    Alcohol/week: 1.0 standard drink of alcohol    Types: 1 Standard drinks or equivalent per week  Comment: seldom - rare    Drug use: No   Sexual activity: Yes  Other Topics Concern   Not on file  Social History Narrative   Not on file   Social Drivers of Health   Financial Resource Strain: Not on file  Food Insecurity: No Food Insecurity (09/19/2022)   Hunger Vital Sign    Worried About Running Out of Food in the Last Year: Never true    Ran Out of Food in the Last Year: Never true  Transportation Needs: No Transportation Needs (09/19/2022)   PRAPARE - Administrator, Civil Service (Medical): No    Lack of Transportation (Non-Medical): No  Physical Activity: Not on file  Stress: Not on file  Social Connections: Not on file  Intimate Partner Violence: Not At Risk (09/19/2022)   Humiliation, Afraid, Rape, and Kick questionnaire    Fear of Current or Ex-Partner: No    Emotionally Abused: No    Physically Abused:  No    Sexually Abused: No   Family History  Problem Relation Age of Onset   Hypertension Father    Diabetes Father    Stroke Father 54   Colon cancer Neg Hx    Colon polyps Neg Hx    Esophageal cancer Neg Hx    Rectal cancer Neg Hx    Stomach cancer Neg Hx    Past Surgical History:  Procedure Laterality Date   BICEPS TENDON REPAIR     CORONARY STENT INTERVENTION N/A 09/19/2022   Procedure: CORONARY STENT INTERVENTION;  Surgeon: Avanell Leigh, MD;  Location: [facility];  Service: Cardiovascular;  Laterality: N/A;   LEFT HEART CATH AND CORONARY ANGIOGRAPHY N/A 09/19/2022   Procedure: LEFT HEART CATH AND CORONARY ANGIOGRAPHY;  Surgeon: Avanell Leigh, MD;  Location: [facility];  Service: Cardiovascular;  Laterality: N/A;   ROTATOR CUFF REPAIR     bilateral   TONSILLECTOMY AND ADENOIDECTOMY     undescended testicle     lesion penis removed  63 y/o       Afton Albright, MD 12/31/23 1718

## 2024-01-05 ENCOUNTER — Encounter: Payer: Self-pay | Admitting: Family Medicine

## 2024-01-05 ENCOUNTER — Other Ambulatory Visit: Payer: Self-pay | Admitting: Family Medicine

## 2024-01-05 ENCOUNTER — Ambulatory Visit: Admitting: Family Medicine

## 2024-01-05 VITALS — BP 196/118 | HR 69 | Wt 243.5 lb

## 2024-01-05 DIAGNOSIS — I10 Essential (primary) hypertension: Secondary | ICD-10-CM | POA: Diagnosis not present

## 2024-01-05 DIAGNOSIS — Z6838 Body mass index (BMI) 38.0-38.9, adult: Secondary | ICD-10-CM

## 2024-01-05 DIAGNOSIS — E785 Hyperlipidemia, unspecified: Secondary | ICD-10-CM

## 2024-01-05 DIAGNOSIS — Z0001 Encounter for general adult medical examination with abnormal findings: Secondary | ICD-10-CM

## 2024-01-05 DIAGNOSIS — K219 Gastro-esophageal reflux disease without esophagitis: Secondary | ICD-10-CM | POA: Insufficient documentation

## 2024-01-05 DIAGNOSIS — Z0000 Encounter for general adult medical examination without abnormal findings: Secondary | ICD-10-CM | POA: Insufficient documentation

## 2024-01-05 DIAGNOSIS — E1169 Type 2 diabetes mellitus with other specified complication: Secondary | ICD-10-CM

## 2024-01-05 DIAGNOSIS — I2510 Atherosclerotic heart disease of native coronary artery without angina pectoris: Secondary | ICD-10-CM

## 2024-01-05 MED ORDER — CARVEDILOL 6.25 MG PO TABS
6.2500 mg | ORAL_TABLET | Freq: Two times a day (BID) | ORAL | 0 refills | Status: DC
Start: 2024-01-05 — End: 2024-01-06

## 2024-01-05 MED ORDER — AMLODIPINE BESYLATE 5 MG PO TABS: 5.0000 mg | ORAL_TABLET | Freq: Every day | ORAL | 0 refills | Status: DC

## 2024-01-05 MED ORDER — ATORVASTATIN CALCIUM 80 MG PO TABS: 80.0000 mg | ORAL_TABLET | Freq: Every day | ORAL | 0 refills | Status: DC

## 2024-01-05 NOTE — Assessment & Plan Note (Signed)
 Continue Lansoprazole 15mg  daily. Continue anti-reflux measures such as raising the head of the bed, avoiding tight clothing or belts, avoiding eating late at night and not lying down shortly after mealtime and achieving weight loss are discussed. Avoid ASA, NSAID's, caffeine, peppermints, alcohol and tobacco.Alert me if there are persistent symptoms, dysphagia, weight loss or GI bleeding.

## 2024-01-05 NOTE — Assessment & Plan Note (Signed)
 Labs today. Continue Atorvastatin  and Zetia . I recommend consuming a heart healthy diet such as Mediterranean diet or DASH diet with whole grains, fruits, vegetable, fish, lean meats, nuts, and olive oil. Limit sweets and processed foods. I also encourage moderate intensity exercise 150 minutes weekly. This is 3-5 times weekly for 30-50 minutes each session. Goal should be pace of 3 miles/hours, or walking 1.5 miles in 30 minutes. The ASCVD Risk score (Arnett DK, et al., 2019) failed to calculate for the following reasons:   Risk score cannot be calculated because patient has a medical history suggesting prior/existing ASCVD

## 2024-01-05 NOTE — Assessment & Plan Note (Signed)
 Counseled on importance of weight management for overall health. Encouraged low calorie, heart healthy diet and moderate intensity exercise 150 minutes weekly. This is 3-5 times weekly for 30-50 minutes each session. Goal should be pace of 3 miles/hours, or walking 1.5 miles in 30 minutes and include strength training. Discussed risks of obesity. Considering Mounjaro

## 2024-01-05 NOTE — Assessment & Plan Note (Signed)
 Last A1c 8.8% not monitoring. Not taking Jardiance . Labs today. Interested in Mounjaro. Follow up in 1 week. Diabetes supplies ordered and encouraged to check twice daily. Needs diabetes education.  A1c and uACR today. Foot exam due. Vaccines due. Retinal eye exam due. Recommend heart healthy diet such as Mediterranean diet with whole grains, fruits, vegetable, fish, lean meats, nuts, and olive oil. Limit salt. Encouraged moderate walking, 3-5 times/week for 30-50 minutes each session. Aim for at least 150 minutes.week. Goal should be pace of 3 miles/hours, or walking 1.5 miles in 30 minutes. Seek medical care for urinary frequency, extreme thirst, vision changes, lightheadedness, dizziness.  Follow up in 1 week.

## 2024-01-05 NOTE — Progress Notes (Signed)
 New Patient Office Visit  Subjective    Patient ID: Blake Smith, male    DOB: November 28, 1960  Age: 63 y.o. MRN: [id number]  CC:  Chief Complaint  Patient presents with   Establish Care   Hypertension    ED visit on 12/31/23-Was prescribed Losartan -been taking meds since 01/01/22    HPI ROYLE Smith presents to establish care. Oriented to practice routines and expectations. No recent PCP. PMH include NSTEMI, HTN, HLD, DM2, CAD, GERD, ED. Has not been seeing PCP regularly but has established with Cardiology last year, not following. Ran out of BP meds and reported to ED last week for med refills. Previously taking Amlodipine , Atorvastatin , Jardiance  up until 3 months ago, other medications have been refilled.   HYPERTENSION / HYPERLIPIDEMIA Satisfied with current treatment? yes Duration of hypertension: chronic BP monitoring frequency: a few times a month BP range: 120/90, 119/70 BP medication side effects: no Past BP meds: amlodipine , carvedilol , losartan  Duration of hyperlipidemia: chronic Cholesterol medication side effects: no Cholesterol supplements: fish oil  Past cholesterol medications: atorvastatin , zetia  Medication compliance: poor compliance Aspirin : yes Recent stressors: no Recurrent headaches: no Visual changes: no Palpitations: no Dyspnea: no Chest pain: no Lower extremity edema: no Dizzy/lightheaded: no  DIABETES Hypoglycemic episodes:no Polydipsia/polyuria: no Visual disturbance: no Chest pain: no Paresthesias: no Glucose Monitoring: no  Accucheck frequency: Not Checking  Fasting glucose:  Post prandial:  Evening:  Before meals: Taking Insulin ?: no  Long acting insulin :  Short acting insulin : Blood Pressure Monitoring: not checking Retinal Examination: Not up to Date Foot Exam: Not up to Date Diabetic Education: Not Completed Pneumovax: Not up to Date Influenza: unknown Aspirin : yes  HLD: continue Atorvastatin , Zetia   GERD: well controlled  on Lansoprazole 15mg  daily   Outpatient Encounter Medications as of 01/05/2024  Medication Sig   aspirin  81 MG chewable tablet Chew 1 tablet (81 mg total) by mouth daily.   BRILINTA  90 MG TABS tablet TAKE 1 TABLET(90 MG) BY MOUTH TWICE DAILY   ezetimibe  (ZETIA ) 10 MG tablet TAKE 1 TABLET(10 MG) BY MOUTH DAILY   lansoprazole (PREVACID SOLUTAB) 15 MG disintegrating tablet Take 15 mg by mouth daily.   losartan  (COZAAR ) 50 MG tablet Take 1 tablet (50 mg total) by mouth daily.   nitroGLYCERIN  (NITROSTAT ) 0.4 MG SL tablet Place 1 tablet (0.4 mg total) under the tongue every 5 (five) minutes as needed for chest pain (up to 3 doses. If taking 3rd dose, call 911).   Omega-3 Fatty Acids (FISH OIL ) 1000 MG CAPS Take 1 capsule (1,000 mg total) by mouth in the morning and at bedtime.   [DISCONTINUED] carvedilol  (COREG ) 6.25 MG tablet Take 1 tablet (6.25 mg total) by mouth 2 (two) times daily with a meal.   Accu-Chek Softclix Lancets lancets Use as directed (Patient not taking: Reported on 01/05/2024)   acyclovir  (ZOVIRAX ) 400 MG tablet Take 1 tablet (400 mg total) by mouth 3 (three) times daily. (Patient not taking: Reported on 01/05/2024)   amLODipine  (NORVASC ) 5 MG tablet Take 1 tablet (5 mg total) by mouth daily.   atorvastatin  (LIPITOR) 80 MG tablet Take 1 tablet (80 mg total) by mouth daily.   blood glucose meter kit and supplies Dispense based on patient and insurance preference. Use up to four times daily as directed. (FOR ICD-10 E10.9, E11.9). (Patient not taking: Reported on 01/05/2024)   carvedilol  (COREG ) 6.25 MG tablet Take 1 tablet (6.25 mg total) by mouth 2 (two) times daily with a meal.   cetirizine (  ZYRTEC) 10 MG tablet Take 10 mg by mouth daily. (Patient not taking: Reported on 01/05/2024)   empagliflozin  (JARDIANCE ) 10 MG TABS tablet Take 1 tablet (10 mg total) by mouth daily. (Patient not taking: Reported on 01/05/2024)   glucose blood test strip Use as directed (Patient not taking: Reported on  01/05/2024)   sildenafil  (VIAGRA ) 100 MG tablet Take 0.5-1 tablets (50-100 mg total) by mouth daily as needed for erectile dysfunction. (Patient not taking: Reported on 01/05/2024)   [DISCONTINUED] amLODipine  (NORVASC ) 5 MG tablet Take 1 tablet (5 mg total) by mouth daily. (Patient not taking: Reported on 01/05/2024)   [DISCONTINUED] atorvastatin  (LIPITOR) 80 MG tablet Take 1 tablet (80 mg total) by mouth daily. (Patient not taking: Reported on 01/05/2024)   No facility-administered encounter medications on file as of 01/05/2024.    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    Past Surgical History:  Procedure Laterality Date   BICEPS TENDON REPAIR     CORONARY STENT INTERVENTION N/A 09/19/2022   Procedure: CORONARY STENT INTERVENTION;  Surgeon: Avanell Leigh, MD;  Location: [facility];  Service: Cardiovascular;  Laterality: N/A;   LEFT HEART CATH AND CORONARY ANGIOGRAPHY N/A 09/19/2022   Procedure: LEFT HEART CATH AND CORONARY ANGIOGRAPHY;  Surgeon: Avanell Leigh, MD;  Location: [facility];  Service: Cardiovascular;  Laterality: N/A;   ROTATOR CUFF REPAIR     bilateral   TONSILLECTOMY AND ADENOIDECTOMY     undescended testicle     lesion penis removed  63 y/o    Family History  Problem Relation Age of Onset   Hypertension Father    Diabetes Father    Stroke Father 36   Colon cancer Neg Hx    Colon polyps Neg Hx    Esophageal cancer Neg Hx    Rectal cancer Neg Hx    Stomach cancer Neg Hx     Social History   Socioeconomic History   Marital status: Married    Spouse name:  Not on file   Number of children: Not on file   Years of education: Not on file   Highest education level: Associate degree: occupational, Scientist, product/process development, or vocational program  Occupational History   Occupation: Careers information officer: UNEMPLOYED  Tobacco Use   Smoking status: Never   Smokeless tobacco: Current    Types: Snuff  Vaping Use   Vaping status: Never Used  Substance and Sexual Activity   Alcohol use: Yes    Alcohol/week: 1.0 standard drink of alcohol    Types: 1 Standard drinks or equivalent per week    Comment: seldom - rare    Drug use: No   Sexual activity: Yes  Other Topics Concern   Not on file  Social History Narrative   Not on file   Social Drivers of Health   Financial Resource Strain: Low Risk  (01/01/2024)   Overall Financial Resource Strain (CARDIA)    Difficulty of Paying Living Expenses: Not hard at all  Food Insecurity: No Food Insecurity (01/01/2024)   Hunger Vital Sign  Worried About Programme researcher, broadcasting/film/video in the Last Year: Never true    Ran Out of Food in the Last Year: Never true  Transportation Needs: No Transportation Needs (01/01/2024)   PRAPARE - Administrator, Civil Service (Medical): No    Lack of Transportation (Non-Medical): No  Physical Activity: Unknown (01/01/2024)   Exercise Vital Sign    Days of Exercise per Week: 0 days    Minutes of Exercise per Session: Not on file  Stress: No Stress Concern Present (01/01/2024)   [employer] of Occupational Health - Occupational Stress Questionnaire    Feeling of Stress : Only a little  Social Connections: Socially Integrated (01/01/2024)   Social Connection and Isolation Panel [NHANES]    Frequency of Communication with Friends and Family: More than three times a week    Frequency of Social Gatherings with Friends and Family: Twice a week    Attends Religious Services: More than 4 times per year    Active Member of [employer] or Organizations: Yes    Attends Hospital doctor: More than 4 times per year    Marital Status: Married  Catering manager Violence: Not At Risk (09/19/2022)   Humiliation, Afraid, Rape, and Kick questionnaire    Fear of Current or Ex-Partner: No    Emotionally Abused: No    Physically Abused: No    Sexually Abused: No    Review of Systems  Constitutional: Negative.   HENT: Negative.    Eyes: Negative.   Respiratory: Negative.    Cardiovascular: Negative.   Gastrointestinal: Negative.   Genitourinary: Negative.   Musculoskeletal: Negative.   Skin: Negative.   Neurological: Negative.   Endo/Heme/Allergies: Negative.   Psychiatric/Behavioral: Negative.    All other systems reviewed and are negative.       Objective    BP (!) 196/118   Pulse 69   Wt 243 lb 8 oz (110.5 kg)   SpO2 98%   BMI 38.14 kg/m     01/05/2024    8:24 AM 12/31/2023    4:26 PM 12/31/2023    4:25 PM  Vitals with BMI  Height   5\' 7"   Weight 243 lbs 8 oz  235 lbs  BMI 38.13  36.8  Systolic 196 179   Diastolic 118 115   Pulse 69 84       Physical Exam Vitals and nursing note reviewed.  Constitutional:      Appearance: Normal appearance. He is obese.  HENT:     Head: Normocephalic and atraumatic.     Right Ear: Tympanic membrane, ear canal and external ear normal.     Left Ear: Tympanic membrane, ear canal and external ear normal.     Nose: Nose normal.     Mouth/Throat:     Mouth: Mucous membranes are moist.     Pharynx: Oropharynx is clear.  Eyes:     Extraocular Movements: Extraocular movements intact.     Right eye: Normal extraocular motion and no nystagmus.     Left eye: Normal extraocular motion and no nystagmus.     Conjunctiva/sclera: Conjunctivae normal.     Pupils: Pupils are equal, round, and reactive to light.  Cardiovascular:     Rate and Rhythm: Normal rate and regular rhythm.     Pulses: Normal pulses.     Heart sounds: Normal heart sounds.  Pulmonary:     Effort: Pulmonary effort is normal.     Breath  sounds: Normal breath  sounds.  Abdominal:     General: Bowel sounds are normal.     Palpations: Abdomen is soft.  Genitourinary:    Comments: Deferred using shared decision making Musculoskeletal:        General: Normal range of motion.     Cervical back: Normal range of motion and neck supple.  Skin:    General: Skin is warm and dry.     Capillary Refill: Capillary refill takes less than 2 seconds.  Neurological:     General: No focal deficit present.     Mental Status: He is alert. Mental status is at baseline.  Psychiatric:        Mood and Affect: Mood normal.        Speech: Speech normal.        Behavior: Behavior normal.        Thought Content: Thought content normal.        Cognition and Memory: Cognition and memory normal.        Judgment: Judgment normal.         Assessment & Plan:   Problem List Items Addressed This Visit     HTN (hypertension)   Uncontrolled. NSR on EKG. Continue Losartan  50mg  daily, Carvedilol  6.25mg  daily (restarted these 3 days ago). Restart Amlodipine  5mg  daily. Strongly encouraged to follow up with Cardiology. Recommend heart healthy diet such as Mediterranean diet with whole grains, fruits, vegetable, fish, lean meats, nuts, and olive oil. Limit salt. Encouraged moderate walking, 3-5 times/week for 30-50 minutes each session. Aim for at least 150 minutes.week. Goal should be pace of 3 miles/hours, or walking 1.5 miles in 30 minutes. Avoid tobacco products. Avoid excess alcohol. Take medications as prescribed and bring medications and blood pressure log with cuff to each office visit. Seek medical care for chest pain, palpitations, shortness of breath with exertion, dizziness/lightheadedness, vision changes, recurrent headaches, or swelling of extremities. Labs today. Follow up in 1 week with home BP readings.       Relevant Medications   amLODipine  (NORVASC ) 5 MG tablet   atorvastatin  (LIPITOR) 80 MG tablet   carvedilol  (COREG ) 6.25 MG tablet    Other Relevant Orders   CBC with Differential/Platelet   Comprehensive metabolic panel with GFR   Lipid panel   Hemoglobin A1c   Microalbumin / creatinine urine ratio   Ambulatory referral to Gastroenterology   CAD in native artery   Relevant Medications   amLODipine  (NORVASC ) 5 MG tablet   atorvastatin  (LIPITOR) 80 MG tablet   carvedilol  (COREG ) 6.25 MG tablet   Other Relevant Orders   CBC with Differential/Platelet   Comprehensive metabolic panel with GFR   Lipid panel   Hemoglobin A1c   Microalbumin / creatinine urine ratio   Ambulatory referral to Gastroenterology   Hyperlipidemia LDL goal <70   Labs today. Continue Atorvastatin  and Zetia . I recommend consuming a heart healthy diet such as Mediterranean diet or DASH diet with whole grains, fruits, vegetable, fish, lean meats, nuts, and olive oil. Limit sweets and processed foods. I also encourage moderate intensity exercise 150 minutes weekly. This is 3-5 times weekly for 30-50 minutes each session. Goal should be pace of 3 miles/hours, or walking 1.5 miles in 30 minutes. The ASCVD Risk score (Arnett DK, et al., 2019) failed to calculate for the following reasons:   Risk score cannot be calculated because patient has a medical history suggesting prior/existing ASCVD       Relevant Medications   amLODipine  (NORVASC ) 5 MG tablet  atorvastatin  (LIPITOR) 80 MG tablet   carvedilol  (COREG ) 6.25 MG tablet   Type 2 diabetes mellitus with obesity (HCC)   Last A1c 8.8% not monitoring. Not taking Jardiance . Labs today. Interested in Mounjaro. Follow up in 1 week. Diabetes supplies ordered and encouraged to check twice daily. Needs diabetes education.  A1c and uACR today. Foot exam due. Vaccines due. Retinal eye exam due. Recommend heart healthy diet such as Mediterranean diet with whole grains, fruits, vegetable, fish, lean meats, nuts, and olive oil. Limit salt. Encouraged moderate walking, 3-5 times/week for 30-50 minutes each session.  Aim for at least 150 minutes.week. Goal should be pace of 3 miles/hours, or walking 1.5 miles in 30 minutes. Seek medical care for urinary frequency, extreme thirst, vision changes, lightheadedness, dizziness.  Follow up in 1 week.       Relevant Medications   atorvastatin  (LIPITOR) 80 MG tablet   Other Relevant Orders   CBC with Differential/Platelet   Comprehensive metabolic panel with GFR   Lipid panel   Hemoglobin A1c   Microalbumin / creatinine urine ratio   Ambulatory referral to Gastroenterology   Ambulatory referral to diabetic education   Morbid obesity (HCC)   Counseled on importance of weight management for overall health. Encouraged low calorie, heart healthy diet and moderate intensity exercise 150 minutes weekly. This is 3-5 times weekly for 30-50 minutes each session. Goal should be pace of 3 miles/hours, or walking 1.5 miles in 30 minutes and include strength training. Discussed risks of obesity. Considering Mounjaro      Gastroesophageal reflux disease without esophagitis   Continue Lansoprazole 15mg  daily. Continue anti-reflux measures such as raising the head of the bed, avoiding tight clothing or belts, avoiding eating late at night and not lying down shortly after mealtime and achieving weight loss are discussed. Avoid ASA, NSAID's, caffeine, peppermints, alcohol and tobacco.Alert me if there are persistent symptoms, dysphagia, weight loss or GI bleeding.       Physical exam, annual - Primary   Today your medical history was reviewed and routine physical exam with labs was performed. Recommend 150 minutes of moderate intensity exercise weekly and consuming a well-balanced diet. Advised to stop smoking if a smoker, avoid smoking if a non-smoker, limit alcohol consumption to 1 drink per day for women and 2 drinks per day for men, and avoid illicit drug use. Counseled on the importance of sunscreen use. Counseled in mental health awareness and when to seek medical care.  Vaccine maintenance discussed. Appropriate health maintenance items reviewed. Return to office in 1 year for annual physical exam.        Return in about 1 week (around 01/12/2024).   Jenelle Mis, FNP

## 2024-01-05 NOTE — Patient Instructions (Signed)
 It was great to meet you today and I'm excited to have you join the [facility] practice. I hope you had a positive experience today! If you feel so inclined, please feel free to recommend our practice to friends and family. Kurtis Bushman, FNP-C

## 2024-01-05 NOTE — Assessment & Plan Note (Addendum)
 Uncontrolled. NSR on EKG. Continue Losartan  50mg  daily, Carvedilol  6.25mg  daily (restarted these 3 days ago). Restart Amlodipine  5mg  daily. Strongly encouraged to follow up with Cardiology. Recommend heart healthy diet such as Mediterranean diet with whole grains, fruits, vegetable, fish, lean meats, nuts, and olive oil. Limit salt. Encouraged moderate walking, 3-5 times/week for 30-50 minutes each session. Aim for at least 150 minutes.week. Goal should be pace of 3 miles/hours, or walking 1.5 miles in 30 minutes. Avoid tobacco products. Avoid excess alcohol. Take medications as prescribed and bring medications and blood pressure log with cuff to each office visit. Seek medical care for chest pain, palpitations, shortness of breath with exertion, dizziness/lightheadedness, vision changes, recurrent headaches, or swelling of extremities. Labs today. Follow up in 1 week with home BP readings.

## 2024-01-05 NOTE — Assessment & Plan Note (Signed)
Today your medical history was reviewed and routine physical exam with labs was performed. Recommend 150 minutes of moderate intensity exercise weekly and consuming a well-balanced diet. Advised to stop smoking if a smoker, avoid smoking if a non-smoker, limit alcohol consumption to 1 drink per day for women and 2 drinks per day for men, and avoid illicit drug use. Counseled on the importance of sunscreen use. Counseled in mental health awareness and when to seek medical care. Vaccine maintenance discussed. Appropriate health maintenance items reviewed. Return to office in 1 year for annual physical exam.

## 2024-01-06 LAB — COMPREHENSIVE METABOLIC PANEL WITH GFR
AG Ratio: 1.8 (calc) (ref 1.0–2.5)
ALT: 28 U/L (ref 9–46)
AST: 23 U/L (ref 10–35)
Albumin: 4.4 g/dL (ref 3.6–5.1)
Alkaline phosphatase (APISO): 71 U/L (ref 35–144)
BUN: 13 mg/dL (ref 7–25)
CO2: 29 mmol/L (ref 20–32)
Calcium: 10.1 mg/dL (ref 8.6–10.3)
Chloride: 99 mmol/L (ref 98–110)
Creat: 0.83 mg/dL (ref 0.70–1.35)
Globulin: 2.4 g/dL (ref 1.9–3.7)
Glucose, Bld: 174 mg/dL — ABNORMAL HIGH (ref 65–99)
Potassium: 4.7 mmol/L (ref 3.5–5.3)
Sodium: 137 mmol/L (ref 135–146)
Total Bilirubin: 0.7 mg/dL (ref 0.2–1.2)
Total Protein: 6.8 g/dL (ref 6.1–8.1)
eGFR: 100 mL/min/{1.73_m2} (ref >=60)

## 2024-01-06 LAB — CBC WITH DIFFERENTIAL/PLATELET
Absolute Lymphocytes: 1168 {cells}/uL (ref 850–3900)
Absolute Monocytes: 690 {cells}/uL (ref 200–950)
Basophils Absolute: 89 {cells}/uL (ref 0–200)
Basophils Relative: 1.5 %
Eosinophils Absolute: 171 {cells}/uL (ref 15–500)
Eosinophils Relative: 2.9 %
HCT: 48.3 % (ref 38.5–50.0)
Hemoglobin: 16.6 g/dL (ref 13.2–17.1)
MCH: 32.7 pg (ref 27.0–33.0)
MCHC: 34.4 g/dL (ref 32.0–36.0)
MCV: 95.1 fL (ref 80.0–100.0)
MPV: 9.7 fL (ref 7.5–12.5)
Monocytes Relative: 11.7 %
Neutro Abs: 3782 {cells}/uL (ref 1500–7800)
Neutrophils Relative %: 64.1 %
Platelets: 219 10*3/uL (ref 140–400)
RBC: 5.08 10*6/uL (ref 4.20–5.80)
RDW: 12.2 % (ref 11.0–15.0)
Total Lymphocyte: 19.8 %
WBC: 5.9 10*3/uL (ref 3.8–10.8)

## 2024-01-06 LAB — MICROALBUMIN / CREATININE URINE RATIO
Creatinine, Urine: 119 mg/dL (ref 20–320)
Microalb Creat Ratio: 22 mg/g{creat} (ref <30)
Microalb, Ur: 2.6 mg/dL

## 2024-01-06 LAB — LIPID PANEL
Cholesterol: 206 mg/dL — ABNORMAL HIGH (ref <200)
HDL: 56 mg/dL (ref >=40)
LDL Cholesterol (Calc): 116 mg/dL — ABNORMAL HIGH
Non-HDL Cholesterol (Calc): 150 mg/dL — ABNORMAL HIGH (ref <130)
Total CHOL/HDL Ratio: 3.7 (calc) (ref <5.0)
Triglycerides: 217 mg/dL — ABNORMAL HIGH (ref <150)

## 2024-01-06 LAB — HEMOGLOBIN A1C
Hgb A1c MFr Bld: 7.7 % — ABNORMAL HIGH (ref <5.7)
Mean Plasma Glucose: 174 mg/dL
eAG (mmol/L): 9.7 mmol/L

## 2024-01-07 ENCOUNTER — Encounter: Payer: Self-pay | Admitting: Family Medicine

## 2024-01-12 ENCOUNTER — Encounter: Payer: Self-pay | Admitting: Skilled Nursing Facility1

## 2024-01-12 ENCOUNTER — Encounter: Attending: Family Medicine | Admitting: Skilled Nursing Facility1

## 2024-01-12 DIAGNOSIS — E1169 Type 2 diabetes mellitus with other specified complication: Secondary | ICD-10-CM | POA: Insufficient documentation

## 2024-01-12 DIAGNOSIS — E669 Obesity, unspecified: Secondary | ICD-10-CM | POA: Insufficient documentation

## 2024-01-12 NOTE — Progress Notes (Signed)
 Appointment Start time:  9:44  Appointment End time:  10:02  appt ended under 30 minutes due to pt stating he felt he already knew what he needed to know about managing his diabetes so did not charge units.   A1C 8.8  Other Diagnoses: GERD Allergies Hyperlipidemia HTN MI  Pt states he used to eat a lot of steak and fried fish but has cut that back.   Pt states his doctor sent him to this appointment and has no questions and already understand what diabetes is and its pathophysiology.   Breakfast: egg and sausage sandwich  Lunch: frozen meal or pre-made salad Dinner: [place] meat spaghetti   Beverage: diet soda, water   5 times a day 30 minutes   Diabetes Self-Management Education  Visit Type: First/Initial  01/12/2024  Mr. Chi Illescas, identified by name and date of birth, is a 63 y.o. male with a diagnosis of Diabetes: Type 2.   ASSESSMENT  There were no vitals taken for this visit. There is no height or weight on file to calculate BMI.   Diabetes Self-Management Education - 01/12/24 1004       Visit Information   Visit Type First/Initial      Initial Visit   Diabetes Type Type 2    Are you currently following a meal plan? No    Are you taking your medications as prescribed? Yes      Health Coping   How would you rate your overall health? Excellent      Psychosocial Assessment   Patient Belief/Attitude about Diabetes Motivated to manage diabetes      Pre-Education Assessment   Patient understands the diabetes disease and treatment process. Needs Instruction    Patient understands incorporating nutritional management into lifestyle. Needs Instruction    Patient undertands incorporating physical activity into lifestyle. Needs Instruction    Patient understands using medications safely. Needs Instruction    Patient understands monitoring blood glucose, interpreting and using results Needs Instruction    Patient understands prevention, detection, and treatment  of acute complications. Needs Instruction    Patient understands prevention, detection, and treatment of chronic complications. Needs Instruction    Patient understands how to develop strategies to address psychosocial issues. Needs Instruction    Patient understands how to develop strategies to promote health/change behavior. Needs Instruction      Complications   Last HgB A1C per patient/outside source 8.8 %    How often do you check your blood sugar? 0 times/day (not testing)    Have you had a dilated eye exam in the past 12 months? Yes    Have you had a dental exam in the past 12 months? Yes    Are you checking your feet? No      Activity / Exercise   Activity / Exercise Type Light (walking / raking leaves)    How many days per week do you exercise? 5    How many minutes per day do you exercise? 30    Total minutes per week of exercise 150      Patient Education   Previous Diabetes Education No    Healthy Eating Role of diet in the treatment of diabetes and the relationship between the three main macronutrients and blood glucose level;Plate Method;Carbohydrate counting;Information on hints to eating out and maintain blood glucose control.;Effects of alcohol on blood glucose and safety factors with consumption of alcohol.;Meal timing in regards to the patients' current diabetes medication.;Reviewed blood glucose goals for pre  and post meals and how to evaluate the patients' food intake on their blood glucose level.      Outcomes   Expected Outcomes Demonstrated limited interest in learning.  Expect minimal changes    Program Status Completed             Individualized Plan for Diabetes Self-Management Training:   Learning Objective:  Patient will have a greater understanding of diabetes self-management. Patient education plan is to attend individual and/or group sessions per assessed needs and concerns.  Future DSME appointment:  pt does not desire further follow up

## 2024-01-15 ENCOUNTER — Encounter: Payer: Self-pay | Admitting: Family Medicine

## 2024-01-15 ENCOUNTER — Ambulatory Visit: Admitting: Family Medicine

## 2024-01-15 VITALS — BP 132/94 | HR 77 | Ht 67.0 in | Wt 236.0 lb

## 2024-01-15 DIAGNOSIS — Z6836 Body mass index (BMI) 36.0-36.9, adult: Secondary | ICD-10-CM

## 2024-01-15 DIAGNOSIS — I10 Essential (primary) hypertension: Secondary | ICD-10-CM

## 2024-01-15 DIAGNOSIS — E669 Obesity, unspecified: Secondary | ICD-10-CM

## 2024-01-15 DIAGNOSIS — E1169 Type 2 diabetes mellitus with other specified complication: Secondary | ICD-10-CM | POA: Diagnosis not present

## 2024-01-15 DIAGNOSIS — E785 Hyperlipidemia, unspecified: Secondary | ICD-10-CM

## 2024-01-15 MED ORDER — TIRZEPATIDE 2.5 MG/0.5ML ~~LOC~~ SOAJ: SUBCUTANEOUS | 0 refills | Status: DC

## 2024-01-15 MED ORDER — AMLODIPINE BESYLATE 10 MG PO TABS: 10.0000 mg | ORAL_TABLET | Freq: Every day | ORAL | 1 refills | Status: DC

## 2024-01-15 NOTE — Assessment & Plan Note (Signed)
 Restarted Atorvastatin  and Zetia . I recommend consuming a heart healthy diet such as Mediterranean diet or DASH diet with whole grains, fruits, vegetable, fish, lean meats, nuts, and olive oil. Limit sweets and processed foods. I also encourage moderate intensity exercise 150 minutes weekly. This is 3-5 times weekly for 30-50 minutes each session. Goal should be pace of 3 miles/hours, or walking 1.5 miles in 30 minutes. The ASCVD Risk score (Arnett DK, et al., 2019) failed to calculate for the following reasons:   Risk score cannot be calculated because patient has a medical history suggesting prior/existing ASCVD

## 2024-01-15 NOTE — Progress Notes (Signed)
 Subjective:  HPI: Blake Smith is a 63 y.o. male presenting on 01/15/2024 for Hypertension   Hypertension   Patient is in today for HTN and DM follow up. A1c was 7.7% uncontrolled on Jardiance , has failed Metformin  due to GI upset. Would like to start GLP. Denies personal history of pancreatitis and personal or family history of MEN2 or MTC.   HYPERTENSION without Chronic Kidney Disease Hypertension status: better  Satisfied with current treatment? yes Duration of hypertension: chronic BP monitoring frequency:  daily BP range: 132/90 BP medication side effects:  no Medication compliance: excellent compliance Previous BP meds: amlodipine , carvedilol , losartan  Aspirin : no Recurrent headaches: no Visual changes: no Palpitations: no Dyspnea: no Chest pain: no Lower extremity edema: no Dizzy/lightheaded: no  DIABETES Hypoglycemic episodes:no Polydipsia/polyuria: no Visual disturbance: no Chest pain: no Paresthesias: no Glucose Monitoring: no  Accucheck frequency: Not Checking  Fasting glucose:  Post prandial:  Evening:  Before meals: Taking Insulin ?: no  Long acting insulin :  Short acting insulin : Blood Pressure Monitoring: daily Retinal Examination: Not up to Date Foot Exam: Not up to Date Diabetic Education: Completed Pneumovax: Not up to Date Influenza: Up to Date Aspirin : no   Review of Systems  All other systems reviewed and are negative.   Relevant past medical history reviewed and updated as indicated.   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     Past  Surgical History:  Procedure Laterality Date   BICEPS TENDON REPAIR     CORONARY STENT INTERVENTION N/A 09/19/2022   Procedure: CORONARY STENT INTERVENTION;  Surgeon: Avanell Leigh, MD;  Location: [facility];  Service: Cardiovascular;  Laterality: N/A;   LEFT HEART CATH AND CORONARY ANGIOGRAPHY N/A 09/19/2022   Procedure: LEFT HEART CATH AND CORONARY ANGIOGRAPHY;  Surgeon: Avanell Leigh, MD;  Location: [facility];  Service: Cardiovascular;  Laterality: N/A;   ROTATOR CUFF REPAIR     bilateral   TONSILLECTOMY AND ADENOIDECTOMY     undescended testicle     lesion penis removed  63 y/o    Allergies and medications reviewed and updated.   Current Outpatient Medications:    tirzepatide (MOUNJARO) 2.5 MG/0.5ML Pen, Inject 2.5 mg into the skin once a week for 28 days, THEN 5 mg once a week for 28 days., Disp: 6 mL, Rfl: 0   Accu-Chek Softclix Lancets lancets, Use as directed (Patient not taking: Reported on 01/05/2024), Disp: 100 each, Rfl: 0   acyclovir  (ZOVIRAX ) 400 MG tablet, Take 1 tablet (400 mg total) by mouth 3 (three) times daily. (Patient not taking: Reported on 01/05/2024), Disp: 50 tablet, Rfl: 0   amLODipine  (NORVASC ) 10 MG tablet, Take 1 tablet (10 mg total) by mouth daily., Disp: 90 tablet, Rfl: 1   aspirin  81 MG chewable tablet, Chew 1 tablet (81 mg total) by mouth daily., Disp: 30 tablet, Rfl: 2   atorvastatin  (LIPITOR) 80 MG tablet, TAKE 1 TABLET(80 MG) BY MOUTH DAILY, Disp: 90 tablet, Rfl: 1   blood glucose meter kit and supplies, Dispense based on patient and insurance preference. Use  up to four times daily as directed. (FOR ICD-10 E10.9, E11.9). (Patient not taking: Reported on 01/05/2024), Disp: 1 each, Rfl: 0   BRILINTA  90 MG TABS tablet, TAKE 1 TABLET(90 MG) BY MOUTH TWICE DAILY, Disp: 60 tablet, Rfl: 11   carvedilol  (COREG ) 6.25 MG tablet, TAKE 1 TABLET(6.25 MG) BY MOUTH TWICE DAILY WITH A MEAL, Disp: 180 tablet, Rfl: 1   cetirizine (ZYRTEC) 10 MG tablet,  Take 10 mg by mouth daily. (Patient not taking: Reported on 01/05/2024), Disp: , Rfl:    empagliflozin  (JARDIANCE ) 10 MG TABS tablet, Take 1 tablet (10 mg total) by mouth daily. (Patient not taking: Reported on 01/05/2024), Disp: 15 tablet, Rfl: 0   ezetimibe  (ZETIA ) 10 MG tablet, TAKE 1 TABLET(10 MG) BY MOUTH DAILY, Disp: 15 tablet, Rfl: 0   glucose blood test strip, Use as directed (Patient not taking: Reported on 01/05/2024), Disp: 100 each, Rfl: 0   lansoprazole (PREVACID SOLUTAB) 15 MG disintegrating tablet, Take 15 mg by mouth daily., Disp: , Rfl:    losartan  (COZAAR ) 50 MG tablet, Take 1 tablet (50 mg total) by mouth daily., Disp: 30 tablet, Rfl: 2   nitroGLYCERIN  (NITROSTAT ) 0.4 MG SL tablet, Place 1 tablet (0.4 mg total) under the tongue every 5 (five) minutes as needed for chest pain (up to 3 doses. If taking 3rd dose, call 911)., Disp: 25 tablet, Rfl: 3   Omega-3 Fatty Acids (FISH OIL ) 1000 MG CAPS, Take 1 capsule (1,000 mg total) by mouth in the morning and at bedtime., Disp: , Rfl: 0   sildenafil  (VIAGRA ) 100 MG tablet, Take 0.5-1 tablets (50-100 mg total) by mouth daily as needed for erectile dysfunction. (Patient not taking: Reported on 01/05/2024), Disp: 30 tablet, Rfl: 11  Allergies  Allergen Reactions   Metformin  And Related Diarrhea    Objective:   BP (!) 132/94   Pulse 77   Ht 5\' 7"  (1.702 m)   Wt 236 lb (107 kg)   SpO2 98%   BMI 36.96 kg/m      01/15/2024    8:27 AM 01/12/2024    9:45 AM 01/05/2024    8:24 AM  Vitals with BMI  Height 5\' 7"     Weight 236 lbs -- 243 lbs 8 oz  BMI 36.95  38.13  Systolic 132  196  Diastolic 94  118  Pulse 77  69     Physical Exam Vitals and nursing note reviewed.  Constitutional:      Appearance: Normal appearance. He is normal weight.  HENT:     Head: Normocephalic and atraumatic.  Cardiovascular:     Rate and Rhythm: Normal rate and regular rhythm.     Pulses: Normal pulses.     Heart sounds: Normal heart sounds.  Pulmonary:      Effort: Pulmonary effort is normal.     Breath sounds: Normal breath sounds.  Skin:    General: Skin is warm and dry.     Capillary Refill: Capillary refill takes less than 2 seconds.  Neurological:     General: No focal deficit present.     Mental Status: He is alert and oriented to person, place, and time. Mental status is at baseline.  Psychiatric:        Mood and Affect: Mood normal.        Behavior: Behavior normal.        Thought Content: Thought content normal.        Judgment: Judgment normal.     Assessment &  Plan:  Primary hypertension Assessment & Plan: Not at goal <130/80. Increase Amlodipine  to 10mg  daily. Continue Losartan  50mg  daily, Carvedilol  6.25mg  daily. Recommend heart healthy diet such as Mediterranean diet with whole grains, fruits, vegetable, fish, lean meats, nuts, and olive oil. Limit salt. Encouraged moderate walking, 3-5 times/week for 30-50 minutes each session. Aim for at least 150 minutes.week. Goal should be pace of 3 miles/hours, or walking 1.5 miles in 30 minutes. Avoid tobacco products. Avoid excess alcohol. Take medications as prescribed and bring medications and blood pressure log with cuff to each office visit. Seek medical care for chest pain, palpitations, shortness of breath with exertion, dizziness/lightheadedness, vision changes, recurrent headaches, or swelling of extremities. Follow up in 4 weeks   Hyperlipidemia LDL goal <70 Assessment & Plan: Restarted Atorvastatin  and Zetia . I recommend consuming a heart healthy diet such as Mediterranean diet or DASH diet with whole grains, fruits, vegetable, fish, lean meats, nuts, and olive oil. Limit sweets and processed foods. I also encourage moderate intensity exercise 150 minutes weekly. This is 3-5 times weekly for 30-50 minutes each session. Goal should be pace of 3 miles/hours, or walking 1.5 miles in 30 minutes. The ASCVD Risk score (Arnett DK, et al., 2019) failed to calculate for the  following reasons:   Risk score cannot be calculated because patient has a medical history suggesting prior/existing ASCVD    Type 2 diabetes mellitus with obesity (HCC) Assessment & Plan: Last A1c 7.7% . Not monitoring. Continue Jardiance . Start Mounjaro 2.5mg  weekly and increase as tolerated Completed diabetes education, foot exam and PNA vaccine today A1c and uACR UTD. Foot exam UTD. Vaccines UTD. Retinal eye exam due. Recommend heart healthy diet such as Mediterranean diet with whole grains, fruits, vegetable, fish, lean meats, nuts, and olive oil. Limit salt. Encouraged moderate walking, 3-5 times/week for 30-50 minutes each session. Aim for at least 150 minutes.week. Goal should be pace of 3 miles/hours, or walking 1.5 miles in 30 minutes. Seek medical care for urinary frequency, extreme thirst, vision changes, lightheadedness, dizziness.  Follow up in 4 weeks.    Other orders -     amLODIPine  Besylate; Take 1 tablet (10 mg total) by mouth daily.  Dispense: 90 tablet; Refill: 1 -     Tirzepatide; Inject 2.5 mg into the skin once a week for 28 days, THEN 5 mg once a week for 28 days.  Dispense: 6 mL; Refill: 0     Follow up plan: Return in about 4 weeks (around 02/12/2024) for chronic follow-up with labs 1 week prior.  Jenelle Mis, FNP

## 2024-01-15 NOTE — Assessment & Plan Note (Signed)
 Not at goal <130/80. Increase Amlodipine  to 10mg  daily. Continue Losartan  50mg  daily, Carvedilol  6.25mg  daily. Recommend heart healthy diet such as Mediterranean diet with whole grains, fruits, vegetable, fish, lean meats, nuts, and olive oil. Limit salt. Encouraged moderate walking, 3-5 times/week for 30-50 minutes each session. Aim for at least 150 minutes.week. Goal should be pace of 3 miles/hours, or walking 1.5 miles in 30 minutes. Avoid tobacco products. Avoid excess alcohol. Take medications as prescribed and bring medications and blood pressure log with cuff to each office visit. Seek medical care for chest pain, palpitations, shortness of breath with exertion, dizziness/lightheadedness, vision changes, recurrent headaches, or swelling of extremities. Follow up in 4 weeks

## 2024-01-15 NOTE — Assessment & Plan Note (Signed)
 Last A1c 7.7% . Not monitoring. Continue Jardiance . Start Mounjaro 2.5mg  weekly and increase as tolerated Completed diabetes education, foot exam and PNA vaccine today A1c and uACR UTD. Foot exam UTD. Vaccines UTD. Retinal eye exam due. Recommend heart healthy diet such as Mediterranean diet with whole grains, fruits, vegetable, fish, lean meats, nuts, and olive oil. Limit salt. Encouraged moderate walking, 3-5 times/week for 30-50 minutes each session. Aim for at least 150 minutes.week. Goal should be pace of 3 miles/hours, or walking 1.5 miles in 30 minutes. Seek medical care for urinary frequency, extreme thirst, vision changes, lightheadedness, dizziness.  Follow up in 4 weeks.

## 2024-02-06 ENCOUNTER — Telehealth: Payer: Self-pay

## 2024-02-06 NOTE — Telephone Encounter (Signed)
 Will send this message to Dr. Arlester Ladd and RN for further review and advisement, per pt assistance team request.

## 2024-02-06 NOTE — Telephone Encounter (Signed)
 Received a fax notice from pharmacy for a drug change request on Brilinta  (see chart media). Please advise if requested change is appropriate for this patient. If so please D/C Brilinta  and send in RX for plavix.

## 2024-02-09 NOTE — Telephone Encounter (Signed)
 Pt hasn't followed up here in over 12 months. Reviewed his chart and cath films. He underwent PCI about 18 months ago. No indication to stay on P2Y12 inhibitors at this time. He should remain on ASA 81 mg daily and can stop ticagrelor  without a replacement drug. Should have cardiology follow-up scheduled with me or an APP. thanks

## 2024-02-09 NOTE — Telephone Encounter (Signed)
 Patient returned call to office and informed him of Cooper's comments regarding stopping Brilinta . Patient verbalized understanding, states he will continue Asa 81mg . He states he's following closely with his PCP and does not wish to make appt with cardiology at this time. If something changes or she recommends, he will call our office back. MAR updated at this time.

## 2024-02-19 ENCOUNTER — Other Ambulatory Visit

## 2024-02-19 DIAGNOSIS — E785 Hyperlipidemia, unspecified: Secondary | ICD-10-CM

## 2024-02-19 DIAGNOSIS — I10 Essential (primary) hypertension: Secondary | ICD-10-CM

## 2024-02-19 LAB — COMPLETE METABOLIC PANEL WITHOUT GFR
AG Ratio: 1.8 (calc) (ref 1.0–2.5)
ALT: 21 U/L (ref 9–46)
AST: 24 U/L (ref 10–35)
Albumin: 4.4 g/dL (ref 3.6–5.1)
Alkaline phosphatase (APISO): 75 U/L (ref 35–144)
BUN: 15 mg/dL (ref 7–25)
CO2: 26 mmol/L (ref 20–32)
Calcium: 9.5 mg/dL (ref 8.6–10.3)
Chloride: 102 mmol/L (ref 98–110)
Creat: 0.82 mg/dL (ref 0.70–1.35)
Globulin: 2.4 g/dL (ref 1.9–3.7)
Glucose, Bld: 132 mg/dL — ABNORMAL HIGH (ref 65–99)
Potassium: 3.9 mmol/L (ref 3.5–5.3)
Sodium: 139 mmol/L (ref 135–146)
Total Bilirubin: 0.6 mg/dL (ref 0.2–1.2)
Total Protein: 6.8 g/dL (ref 6.1–8.1)

## 2024-02-19 LAB — LIPID PANEL
Cholesterol: 167 mg/dL (ref <200)
HDL: 51 mg/dL (ref >=40)
LDL Cholesterol (Calc): 82 mg/dL
Non-HDL Cholesterol (Calc): 116 mg/dL (ref <130)
Total CHOL/HDL Ratio: 3.3 (calc) (ref <5.0)
Triglycerides: 242 mg/dL — ABNORMAL HIGH (ref <150)

## 2024-02-19 NOTE — Addendum Note (Signed)
 Addended by: Vallerie Gave on: 02/19/2024 09:53 AM   Modules accepted: Orders

## 2024-02-23 ENCOUNTER — Telehealth: Payer: Self-pay | Admitting: Family Medicine

## 2024-02-23 ENCOUNTER — Telehealth: Payer: Self-pay

## 2024-02-23 NOTE — Telephone Encounter (Signed)
 Copied from CRM [id number]. Topic: Referral - Question >> Feb 23, 2024 11:21 AM Lizabeth Riggs wrote: Reason for CRM:  Merlyn Starring called [facility] and they need a referral for a colonoscopy. Please send them a new referral and let Merlyn Starring know on MyChart when completed.

## 2024-02-23 NOTE — Telephone Encounter (Signed)
 Copied from CRM [id number]. Topic: Referral - Request for Referral >> Feb 23, 2024  3:12 PM Carlatta H wrote: Did the patient discuss referral with their provider in the last year? Yes (If No - schedule appointment) (If Yes - send message)  Appointment offered? No  Type of order/referral and detailed reason for visit: Diabetic Eye Exam  Preference of office, provider, location: [facility] Fax number is [phone]  If referral order, have you been seen by this specialty before? Yes (If Yes, this issue or another issue? When? Where?  Can we respond through MyChart? Yes

## 2024-02-24 ENCOUNTER — Encounter: Payer: Self-pay | Admitting: Family Medicine

## 2024-02-24 ENCOUNTER — Ambulatory Visit: Admitting: Family Medicine

## 2024-02-24 ENCOUNTER — Other Ambulatory Visit: Payer: Self-pay

## 2024-02-24 VITALS — BP 137/82 | HR 79 | Temp 97.8°F | Ht 67.0 in

## 2024-02-24 DIAGNOSIS — E669 Obesity, unspecified: Secondary | ICD-10-CM

## 2024-02-24 DIAGNOSIS — Z6836 Body mass index (BMI) 36.0-36.9, adult: Secondary | ICD-10-CM | POA: Diagnosis not present

## 2024-02-24 DIAGNOSIS — I10 Essential (primary) hypertension: Secondary | ICD-10-CM

## 2024-02-24 DIAGNOSIS — E1169 Type 2 diabetes mellitus with other specified complication: Secondary | ICD-10-CM

## 2024-02-24 DIAGNOSIS — Z7985 Long-term (current) use of injectable non-insulin antidiabetic drugs: Secondary | ICD-10-CM

## 2024-02-24 DIAGNOSIS — Z1211 Encounter for screening for malignant neoplasm of colon: Secondary | ICD-10-CM

## 2024-02-24 MED ORDER — TIRZEPATIDE 5 MG/0.5ML ~~LOC~~ SOAJ: 5.0000 mg | SUBCUTANEOUS | 1 refills | Status: DC

## 2024-02-24 NOTE — Assessment & Plan Note (Signed)
 Last A1c 7.7% . Home BG at goal. Continue Jardiance . Increase Mounjaro  to 5mg  weekly and increase as tolerated A1c and uACR UTD. Foot exam UTD. Vaccines UTD. Retinal eye exam due. Recommend heart healthy diet such as Mediterranean diet with whole grains, fruits, vegetable, fish, lean meats, nuts, and olive oil. Limit salt. Encouraged moderate walking, 3-5 times/week for 30-50 minutes each session. Aim for at least 150 minutes.week. Goal should be pace of 3 miles/hours, or walking 1.5 miles in 30 minutes. Seek medical care for urinary frequency, extreme thirst, vision changes, lightheadedness, dizziness.  Follow up in 3 months

## 2024-02-24 NOTE — Assessment & Plan Note (Signed)
 At goal <130/80. Continue Amlodipine  10mg  daily, Losartan  50mg  daily, Carvedilol  6.25mg  daily. Recommend heart healthy diet such as Mediterranean diet with whole grains, fruits, vegetable, fish, lean meats, nuts, and olive oil. Limit salt. Encouraged moderate walking, 3-5 times/week for 30-50 minutes each session. Aim for at least 150 minutes.week. Goal should be pace of 3 miles/hours, or walking 1.5 miles in 30 minutes. Avoid tobacco products. Avoid excess alcohol. Take medications as prescribed and bring medications and blood pressure log with cuff to each office visit. Seek medical care for chest pain, palpitations, shortness of breath with exertion, dizziness/lightheadedness, vision changes, recurrent headaches, or swelling of extremities. Follow up in 3 months

## 2024-02-24 NOTE — Progress Notes (Signed)
 Subjective:  HPI: Blake Smith is a 63 y.o. male presenting on 02/24/2024 for Medical Management of Chronic Issues (4 week f/u )   HPI Patient is in today for HTN and DM follow up. 1 month ago amlodipine  was increased to 10mg  daily and was started on Mounjaro  2.5mg  weekly. Is tolerating well and has completed 5 weeks of Mounjaro  with doses on Sunday.  HYPERTENSION without Chronic Kidney Disease Hypertension status: controlled  Satisfied with current treatment? yes Duration of hypertension: chronic BP monitoring frequency:  daily BP range: 130/80s BP medication side effects:  no Medication compliance: excellent compliance Previous BP meds:amlodipine  and losartan  (cozaar ) Aspirin : yes Recurrent headaches: no Visual changes: no Palpitations: no Dyspnea: no Chest pain: no Lower extremity edema: no Dizzy/lightheaded: no  DIABETES Hypoglycemic episodes:no Polydipsia/polyuria: no Visual disturbance: no Chest pain: no Paresthesias: no Glucose Monitoring: yes  Accucheck frequency: BID  Fasting glucose: 90s  Post prandial: 170  Evening:  Before meals: Taking Insulin ?: no  Long acting insulin :  Short acting insulin : Blood Pressure Monitoring: daily Retinal Examination: Up to Date Foot Exam: Up to Date Diabetic Education: Completed Pneumovax: Up to Date Influenza: Up to Date Aspirin : yes   Review of Systems  All other systems reviewed and are negative.   Relevant past medical history reviewed and updated as indicated.   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     Past  Surgical History:  Procedure Laterality Date   BICEPS TENDON REPAIR     CORONARY STENT INTERVENTION N/A 09/19/2022   Procedure: CORONARY STENT INTERVENTION;  Surgeon: Avanell Leigh, MD;  Location: [facility];  Service: Cardiovascular;  Laterality: N/A;   LEFT HEART CATH AND CORONARY ANGIOGRAPHY N/A 09/19/2022   Procedure: LEFT HEART CATH AND CORONARY ANGIOGRAPHY;  Surgeon: Avanell Leigh, MD;  Location: [facility];  Service: Cardiovascular;  Laterality: N/A;   ROTATOR CUFF REPAIR     bilateral   TONSILLECTOMY AND ADENOIDECTOMY     undescended testicle     lesion penis removed  63 y/o    Allergies and medications reviewed and updated.   Current Outpatient Medications:    Accu-Chek Softclix Lancets lancets, Use as directed, Disp: 100 each, Rfl: 0   acyclovir  (ZOVIRAX ) 400 MG tablet, Take 1 tablet (400 mg total) by mouth 3 (three) times daily., Disp: 50 tablet, Rfl: 0   amLODipine  (NORVASC ) 10 MG tablet, Take 1 tablet (10 mg total) by mouth daily., Disp: 90 tablet, Rfl: 1   aspirin  81 MG chewable tablet, Chew 1 tablet (81 mg total) by mouth daily., Disp: 30 tablet, Rfl: 2   atorvastatin  (LIPITOR) 80 MG tablet, TAKE 1 TABLET(80 MG) BY MOUTH DAILY, Disp: 90 tablet, Rfl: 1   blood glucose meter kit and supplies, Dispense based on patient and insurance preference. Use up to four times daily as directed. (FOR ICD-10 E10.9, E11.9)., Disp: 1 each, Rfl: 0   carvedilol  (COREG ) 6.25 MG tablet, TAKE 1 TABLET(6.25 MG) BY MOUTH TWICE DAILY WITH A MEAL, Disp: 180 tablet, Rfl: 1   cetirizine (ZYRTEC) 10  MG tablet, Take 10 mg by mouth daily., Disp: , Rfl:    empagliflozin  (JARDIANCE ) 10 MG TABS tablet, Take 1 tablet (10 mg total) by mouth daily., Disp: 15 tablet, Rfl: 0   ezetimibe  (ZETIA ) 10 MG tablet, TAKE 1 TABLET(10 MG) BY MOUTH DAILY, Disp: 15 tablet, Rfl: 0   glucose blood test strip, Use as directed, Disp: 100 each, Rfl: 0   lansoprazole (PREVACID SOLUTAB) 15 MG disintegrating  tablet, Take 15 mg by mouth daily., Disp: , Rfl:    losartan  (COZAAR ) 50 MG tablet, Take 1 tablet (50 mg total) by mouth daily., Disp: 30 tablet, Rfl: 2   nitroGLYCERIN  (NITROSTAT ) 0.4 MG SL tablet, Place 1 tablet (0.4 mg total) under the tongue every 5 (five) minutes as needed for chest pain (up to 3 doses. If taking 3rd dose, call 911)., Disp: 25 tablet, Rfl: 3   Omega-3 Fatty Acids (FISH OIL ) 1000 MG CAPS, Take 1 capsule (1,000 mg total) by mouth in the morning and at bedtime., Disp: , Rfl: 0   tirzepatide  (MOUNJARO ) 5 MG/0.5ML Pen, Inject 5 mg into the skin once a week., Disp: 6 mL, Rfl: 1   sildenafil  (VIAGRA ) 100 MG tablet, Take 0.5-1 tablets (50-100 mg total) by mouth daily as needed for erectile dysfunction. (Patient not taking: Reported on 02/24/2024), Disp: 30 tablet, Rfl: 11  Allergies  Allergen Reactions   Metformin  And Related Diarrhea    Objective:   BP 137/82   Pulse 79   Temp 97.8 F (36.6 C)   Ht 5' 7 (1.702 m)   SpO2 97%   BMI 36.96 kg/m      02/24/2024    2:36 PM 01/15/2024    8:27 AM 01/12/2024    9:45 AM  Vitals with BMI  Height 5' 7 5' 7   Weight  236 lbs --  BMI  36.95   Systolic 137 132   Diastolic 82 94   Pulse 79 77      Physical Exam Vitals and nursing note reviewed.  Constitutional:      Appearance: Normal appearance. He is normal weight.  HENT:     Head: Normocephalic and atraumatic.   Cardiovascular:     Rate and Rhythm: Normal rate and regular rhythm.     Pulses: Normal pulses.     Heart sounds: Normal heart sounds.  Pulmonary:     Effort: Pulmonary effort is normal.     Breath sounds: Normal breath sounds.   Skin:    General: Skin is warm and dry.     Capillary Refill: Capillary refill takes less than 2 seconds.   Neurological:     General: No focal deficit present.     Mental Status: He is alert and oriented to person, place, and time. Mental status is at baseline.   Psychiatric:        Mood and Affect: Mood normal.         Behavior: Behavior normal.        Thought Content: Thought content normal.        Judgment: Judgment normal.     Assessment & Plan:  Primary hypertension Assessment & Plan: At goal <130/80. Continue Amlodipine  10mg  daily, Losartan  50mg  daily, Carvedilol  6.25mg  daily. Recommend heart healthy diet such as Mediterranean diet with whole grains, fruits, vegetable, fish, lean meats, nuts, and olive oil. Limit salt. Encouraged moderate walking, 3-5 times/week for 30-50 minutes each session. Aim for at least 150 minutes.week. Goal should be pace of 3 miles/hours, or  walking 1.5 miles in 30 minutes. Avoid tobacco products. Avoid excess alcohol. Take medications as prescribed and bring medications and blood pressure log with cuff to each office visit. Seek medical care for chest pain, palpitations, shortness of breath with exertion, dizziness/lightheadedness, vision changes, recurrent headaches, or swelling of extremities. Follow up in 3 months   Type 2 diabetes mellitus with obesity (HCC) Assessment & Plan: Last A1c 7.7% . Home BG at goal. Continue Jardiance . Increase Mounjaro  to 5mg  weekly and increase as tolerated A1c and uACR UTD. Foot exam UTD. Vaccines UTD. Retinal eye exam due. Recommend heart healthy diet such as Mediterranean diet with whole grains, fruits, vegetable, fish, lean meats, nuts, and olive oil. Limit salt. Encouraged moderate walking, 3-5 times/week for 30-50 minutes each session. Aim for at least 150 minutes.week. Goal should be pace of 3 miles/hours, or walking 1.5 miles in 30 minutes. Seek medical care for urinary frequency, extreme thirst, vision changes, lightheadedness, dizziness.  Follow up in 3 months   Other orders -     Tirzepatide ; Inject 5 mg into the skin once a week.  Dispense: 6 mL; Refill: 1     Follow up plan: Return in about 3 months (around 05/26/2024) for chronic follow-up with labs 1 week prior.  Jenelle Mis, FNP

## 2024-02-25 ENCOUNTER — Encounter: Payer: Self-pay | Admitting: Family Medicine

## 2024-02-25 ENCOUNTER — Other Ambulatory Visit: Payer: Self-pay | Admitting: Cardiovascular Disease

## 2024-03-01 ENCOUNTER — Other Ambulatory Visit: Payer: Self-pay | Admitting: Family Medicine

## 2024-03-01 DIAGNOSIS — N529 Male erectile dysfunction, unspecified: Secondary | ICD-10-CM

## 2024-03-01 MED ORDER — SILDENAFIL CITRATE 100 MG PO TABS: 50.0000 mg | ORAL_TABLET | Freq: Every day | ORAL | 1 refills | Status: AC | PRN

## 2024-03-15 ENCOUNTER — Encounter: Admitting: Family Medicine

## 2024-04-01 ENCOUNTER — Encounter: Payer: Self-pay | Admitting: Pediatrics

## 2024-05-17 ENCOUNTER — Encounter: Payer: Self-pay | Admitting: Pediatrics

## 2024-05-17 ENCOUNTER — Ambulatory Visit (AMBULATORY_SURGERY_CENTER)

## 2024-05-17 VITALS — Ht 67.0 in | Wt 220.0 lb

## 2024-05-17 DIAGNOSIS — Z860100 Personal history of colon polyps, unspecified: Secondary | ICD-10-CM

## 2024-05-17 MED ORDER — NA SULFATE-K SULFATE-MG SULF 17.5-3.13-1.6 GM/177ML PO SOLN: 1.0000 | Freq: Once | ORAL | 0 refills | Status: AC

## 2024-05-17 NOTE — Progress Notes (Signed)
 No egg or soy allergy known to patient  No issues known to pt with past sedation with any surgeries or procedures Patient denies ever being told they had issues or difficulty with intubation  No FH of Malignant Hyperthermia  Pt is on Mounjaro  and hold instructions provided  Pt is not on  home 02  Pt is not on blood thinners  Pt denies issues with constipation  No A fib or A flutter Have any cardiac testing pending--No Pt can ambulate   Pt states he uses chewing tobacco/dip - was instructed not to use this the day of the procedure starting at midnight the night before.  Discussed diabetic I weight loss medication holds Discussed NSAID holds Checked BMI Pt instructed to use [employer] or GoodRx for a price reduction on prep  Patient's chart reviewed by Blake Smith CNRA prior to previsit and patient appropriate for the [facility].  Pre visit completed and red dot placed by patient's name on their procedure day (on provider's schedule).

## 2024-05-23 ENCOUNTER — Encounter: Payer: Self-pay | Admitting: Family Medicine

## 2024-05-25 NOTE — Progress Notes (Unsigned)
 [facility] History and Physical   Primary Care Physician:  Kayla Jeoffrey RAMAN, FNP   Reason for Procedure:  History of adenomatous colon polyp  Plan:    Surveillance colonoscopy     HPI: Blake Smith is a 63 y.o. male undergoing surveillance colonoscopy for history of adenomatous colon polyp.  Last colonoscopy was performed in 2017 demonstrating a 4 mm cecal tubular adenoma.  No family history of colorectal cancer or polyps.  Patient denies change in bowel habits or rectal bleeding at the time of this exam.   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    Past Surgical History:  Procedure Laterality Date   BICEPS TENDON REPAIR     CORONARY STENT INTERVENTION N/A 09/19/2022   Procedure: CORONARY STENT INTERVENTION;  Surgeon: Court Dorn PARAS, MD;  Location: [facility];  Service: Cardiovascular;  Laterality: N/A;   LEFT HEART CATH AND CORONARY ANGIOGRAPHY N/A 09/19/2022   Procedure: LEFT HEART CATH AND CORONARY ANGIOGRAPHY;  Surgeon: Court Dorn PARAS, MD;  Location: [facility];  Service: Cardiovascular;  Laterality: N/A;   ROTATOR CUFF REPAIR     bilateral   TONSILLECTOMY AND ADENOIDECTOMY     undescended testicle     lesion penis removed  63 y/o    Prior to Admission medications   Medication Sig Start Date End Date Taking? Authorizing Provider  Accu-Chek Softclix Lancets lancets Use as directed 09/20/22     acyclovir  (ZOVIRAX ) 400 MG tablet Take 1 tablet (400 mg total) by mouth 3 (three) times daily. 12/31/23   Rolinda Rogue, MD  amLODipine  (NORVASC ) 10 MG tablet Take  1 tablet (10 mg total) by mouth daily. 01/15/24   Kayla Jeoffrey RAMAN, FNP  aspirin  81 MG chewable tablet Chew 1 tablet (81 mg total) by mouth daily. 12/31/23   Rolinda Rogue, MD  atorvastatin  (LIPITOR) 80 MG tablet TAKE 1 TABLET(80 MG) BY MOUTH DAILY 01/06/24   Kayla Jeoffrey RAMAN, FNP  blood glucose meter kit and supplies Dispense based on patient and insurance preference. Use up to four times daily as directed. (FOR ICD-10 E10.9, E11.9). 09/20/22   Dunn, Dayna N, PA-C  carvedilol  (COREG ) 6.25 MG tablet TAKE 1 TABLET(6.25 MG) BY MOUTH TWICE DAILY WITH A MEAL 01/06/24   Kayla Jeoffrey RAMAN, FNP  cetirizine (ZYRTEC) 10 MG tablet Take 10 mg by mouth daily.    [provider]  ezetimibe  (ZETIA ) 10 MG tablet TAKE 1 TABLET(10 MG) BY MOUTH DAILY Patient not taking: Reported on 05/17/2024 12/17/23   Wonda Sharper, MD  glucose blood test strip Use as directed 09/20/22     JARDIANCE  10 MG TABS tablet TAKE 1 TABLET(10 MG) BY MOUTH DAILY Patient not taking: Reported on 05/17/2024 02/26/24   Dunn, Dayna N, PA-C  lansoprazole (PREVACID SOLUTAB) 15 MG disintegrating tablet Take 15 mg by mouth daily.    [provider]  losartan  (COZAAR ) 50 MG tablet Take 1 tablet (50 mg total) by mouth daily. 12/31/23   Rolinda Rogue, MD  nitroGLYCERIN  (NITROSTAT ) 0.4 MG SL tablet Place 1 tablet (0.4 mg total)  under the tongue every 5 (five) minutes as needed for chest pain (up to 3 doses. If taking 3rd dose, call 911). 09/20/22   Dunn, Dayna N, PA-C  Omega-3 Fatty Acids (FISH OIL ) 1000 MG CAPS Take 1 capsule (1,000 mg total) by mouth in the morning and at bedtime. 03/03/23   Parthenia Olivia HERO, PA-C  sildenafil  (VIAGRA ) 100 MG tablet Take 0.5-1 tablets (50-100 mg total) by mouth daily as needed for erectile dysfunction. 03/01/24   Kayla Jeoffrey RAMAN, FNP  tirzepatide  (MOUNJARO ) 5 MG/0.5ML Pen Inject 5 mg into the skin once a week. 02/24/24   Kayla Jeoffrey RAMAN, FNP    Current Outpatient Medications  Medication Sig Dispense Refill   Accu-Chek  Softclix Lancets lancets Use as directed 100 each 0   acyclovir  (ZOVIRAX ) 400 MG tablet Take 1 tablet (400 mg total) by mouth 3 (three) times daily. 50 tablet 0   amLODipine  (NORVASC ) 10 MG tablet Take 1 tablet (10 mg total) by mouth daily. 90 tablet 1   aspirin  81 MG chewable tablet Chew 1 tablet (81 mg total) by mouth daily. 30 tablet 2   atorvastatin  (LIPITOR) 80 MG tablet TAKE 1 TABLET(80 MG) BY MOUTH DAILY 90 tablet 1   blood glucose meter kit and supplies Dispense based on patient and insurance preference. Use up to four times daily as directed. (FOR ICD-10 E10.9, E11.9). 1 each 0   carvedilol  (COREG ) 6.25 MG tablet TAKE 1 TABLET(6.25 MG) BY MOUTH TWICE DAILY WITH A MEAL 180 tablet 1   cetirizine (ZYRTEC) 10 MG tablet Take 10 mg by mouth daily.     ezetimibe  (ZETIA ) 10 MG tablet TAKE 1 TABLET(10 MG) BY MOUTH DAILY (Patient not taking: Reported on 05/17/2024) 15 tablet 0   glucose blood test strip Use as directed 100 each 0   JARDIANCE  10 MG TABS tablet TAKE 1 TABLET(10 MG) BY MOUTH DAILY (Patient not taking: Reported on 05/17/2024) 15 tablet 0   lansoprazole (PREVACID SOLUTAB) 15 MG disintegrating tablet Take 15 mg by mouth daily.     losartan  (COZAAR ) 50 MG tablet Take 1 tablet (50 mg total) by mouth daily. 30 tablet 2   nitroGLYCERIN  (NITROSTAT ) 0.4 MG SL tablet Place 1 tablet (0.4 mg total) under the tongue every 5 (five) minutes as needed for chest pain (up to 3 doses. If taking 3rd dose, call 911). 25 tablet 3   Omega-3 Fatty Acids (FISH OIL ) 1000 MG CAPS Take 1 capsule (1,000 mg total) by mouth in the morning and at bedtime.  0   sildenafil  (VIAGRA ) 100 MG tablet Take 0.5-1 tablets (50-100 mg total) by mouth daily as needed for erectile dysfunction. 90 tablet 1   tirzepatide  (MOUNJARO ) 5 MG/0.5ML Pen Inject 5 mg into the skin once a week. 6 mL 1   No current facility-administered medications for this visit.    Allergies as of 05/28/2024 - Review Complete 02/24/2024  Allergen Reaction  Noted   Metformin  and related Diarrhea 09/19/2022    Family History  Problem Relation Age of Onset   Hypertension Father    Diabetes Father    Stroke Father 34   Colon cancer Neg Hx    Colon polyps Neg Hx    Esophageal cancer Neg Hx    Rectal cancer Neg Hx    Stomach cancer Neg Hx     Social History   Socioeconomic History   Marital status: Married    Spouse name: Not on file   Number of children: Not on file  Years of education: Not on file   Highest education level: Associate degree: occupational, Scientist, product/process development, or vocational program  Occupational History   Occupation: Careers information officer: UNEMPLOYED  Tobacco Use   Smoking status: Never   Smokeless tobacco: Current    Types: Snuff  Vaping Use   Vaping status: Never Used  Substance and Sexual Activity   Alcohol use: Yes    Alcohol/week: 1.0 standard drink of alcohol    Types: 1 Standard drinks or equivalent per week    Comment: seldom - rare    Drug use: No   Sexual activity: Yes  Other Topics Concern   Not on file  Social History Narrative   Not on file   Social Drivers of Health   Financial Resource Strain: Low Risk  (02/20/2024)   Overall Financial Resource Strain (CARDIA)    Difficulty of Paying Living Expenses: Not hard at all  Food Insecurity: No Food Insecurity (02/20/2024)   Hunger Vital Sign    Worried About Running Out of Food in the Last Year: Never true    Ran Out of Food in the Last Year: Never true  Transportation Needs: No Transportation Needs (02/20/2024)   PRAPARE - Administrator, Civil Service (Medical): No    Lack of Transportation (Non-Medical): No  Physical Activity: Sufficiently Active (02/20/2024)   Exercise Vital Sign    Days of Exercise per Week: 6 days    Minutes of Exercise per Session: 30 min  Stress: No Stress Concern Present (02/20/2024)   [employer] of Occupational Health - Occupational Stress Questionnaire    Feeling of Stress: Only a little  Social  Connections: Unknown (02/20/2024)   Social Connection and Isolation Panel    Frequency of Communication with Friends and Family: Patient declined    Frequency of Social Gatherings with Friends and Family: Patient declined    Attends Religious Services: Patient declined    Database administrator or Organizations: Yes    Attends Banker Meetings: Patient declined    Marital Status: Married  Catering manager Violence: Not At Risk (09/19/2022)   Humiliation, Afraid, Rape, and Kick questionnaire    Fear of Current or Ex-Partner: No    Emotionally Abused: No    Physically Abused: No    Sexually Abused: No    Review of Systems:  All other review of systems negative except as mentioned in the HPI.  Physical Exam: Vital signs There were no vitals taken for this visit.  General:   Alert,  Well-developed, well-nourished, pleasant and cooperative in NAD Airway:  Mallampati  Lungs:  Clear throughout to auscultation.   Heart:  Regular rate and rhythm; no murmurs, clicks, rubs,  or gallops. Abdomen:  Soft, nontender and nondistended. Normal bowel sounds.   Neuro/Psych:  Normal mood and affect. A and O x 3  Inocente Hausen, MD [facility]

## 2024-05-26 ENCOUNTER — Encounter: Payer: Self-pay | Admitting: Family Medicine

## 2024-05-26 ENCOUNTER — Ambulatory Visit: Admitting: Family Medicine

## 2024-05-26 VITALS — BP 140/89 | HR 83 | Temp 97.8°F | Ht 67.0 in | Wt 231.2 lb

## 2024-05-26 DIAGNOSIS — Z7984 Long term (current) use of oral hypoglycemic drugs: Secondary | ICD-10-CM

## 2024-05-26 DIAGNOSIS — I2510 Atherosclerotic heart disease of native coronary artery without angina pectoris: Secondary | ICD-10-CM | POA: Diagnosis not present

## 2024-05-26 DIAGNOSIS — E669 Obesity, unspecified: Secondary | ICD-10-CM

## 2024-05-26 DIAGNOSIS — I10 Essential (primary) hypertension: Secondary | ICD-10-CM

## 2024-05-26 DIAGNOSIS — E1169 Type 2 diabetes mellitus with other specified complication: Secondary | ICD-10-CM

## 2024-05-26 DIAGNOSIS — E785 Hyperlipidemia, unspecified: Secondary | ICD-10-CM | POA: Diagnosis not present

## 2024-05-26 DIAGNOSIS — K219 Gastro-esophageal reflux disease without esophagitis: Secondary | ICD-10-CM

## 2024-05-26 MED ORDER — EMPAGLIFLOZIN 10 MG PO TABS: 10.0000 mg | ORAL_TABLET | Freq: Every day | ORAL | 3 refills | Status: AC

## 2024-05-26 MED ORDER — CARVEDILOL 6.25 MG PO TABS: 6.2500 mg | ORAL_TABLET | Freq: Two times a day (BID) | ORAL | 1 refills | Status: DC

## 2024-05-26 MED ORDER — CARVEDILOL 12.5 MG PO TABS: 12.5000 mg | ORAL_TABLET | Freq: Two times a day (BID) | ORAL | 1 refills | Status: DC

## 2024-05-26 NOTE — Assessment & Plan Note (Addendum)
 A1c today. Last A1c 7.7% . Home BG at goal. Continue Jardiance  and Mounjaro   5mg  weekly and increase as tolerated A1c and uACR UTD. Foot exam UTD. Vaccines UTD. Retinal eye exam due. Recommend heart healthy diet such as Mediterranean diet with whole grains, fruits, vegetable, fish, lean meats, nuts, and olive oil. Limit salt. Encouraged moderate walking, 3-5 times/week for 30-50 minutes each session. Aim for at least 150 minutes.week. Goal should be pace of 3 miles/hours, or walking 1.5 miles in 30 minutes. Seek medical care for urinary frequency, extreme thirst, vision changes, lightheadedness, dizziness.  Follow up in 3 months

## 2024-05-26 NOTE — Progress Notes (Signed)
 Subjective:  HPI: Blake Smith is a 63 y.o. male presenting on 05/26/2024 for Follow-up   HPI Patient is in today for chronic condition management follow-up.  PMH include NSTEMI, HTN, HLD, DM2, CAD, GERD, ED.  Recently on vacation in [place] and freely eating and drinking alcohol.   HTN: uncontrolled on Amlodipine  10mg  daily, Coreg  6.25mg  BID  HLD: taking atorvastatin  40mg  daily  DM2: <98 FBG, tolerating mounjaro  5mg  daily  CAD: no chest pain with or without exertion  GERD: well controlled  Review of Systems  All other systems reviewed and are negative.   Relevant past medical history reviewed and updated as indicated.   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     Past Surgical History:  Procedure Laterality Date   BICEPS TENDON REPAIR     CORONARY STENT INTERVENTION N/A 09/19/2022   Procedure: CORONARY STENT INTERVENTION;  Surgeon: Court Dorn PARAS, MD;  Location: [facility];  Service: Cardiovascular;  Laterality: N/A;   LEFT HEART CATH AND CORONARY ANGIOGRAPHY N/A 09/19/2022   Procedure: LEFT HEART CATH AND CORONARY ANGIOGRAPHY;  Surgeon: Court Dorn PARAS, MD;  Location: [facility];  Service: Cardiovascular;  Laterality: N/A;   ROTATOR CUFF REPAIR     bilateral   TONSILLECTOMY AND ADENOIDECTOMY     undescended testicle     lesion penis removed  63 y/o    Allergies and medications reviewed and updated.   Current Outpatient Medications:    Accu-Chek Softclix Lancets lancets, Use as directed, Disp: 100 each, Rfl: 0   acyclovir  (ZOVIRAX ) 400 MG tablet, Take 1  tablet (400 mg total) by mouth 3 (three) times daily., Disp: 50 tablet, Rfl: 0   amLODipine  (NORVASC ) 10 MG tablet, Take 1 tablet (10 mg total) by mouth daily., Disp: 90 tablet, Rfl: 1   atorvastatin  (LIPITOR) 80 MG tablet, TAKE 1 TABLET(80 MG) BY MOUTH DAILY, Disp: 90 tablet, Rfl: 1   blood glucose meter kit and supplies, Dispense based on patient and insurance preference. Use up to four times daily as directed. (FOR ICD-10 E10.9, E11.9)., Disp: 1 each, Rfl: 0   cetirizine (ZYRTEC) 10 MG tablet, Take 10 mg by mouth daily., Disp: , Rfl:    empagliflozin  (JARDIANCE ) 10 MG TABS tablet, Take 1 tablet (10 mg total) by mouth daily., Disp: 90 tablet, Rfl: 3   glucose blood test strip, Use as directed, Disp: 100 each, Rfl: 0   lansoprazole (PREVACID SOLUTAB) 15 MG disintegrating tablet, Take 15 mg by mouth daily., Disp: , Rfl:    nitroGLYCERIN  (NITROSTAT ) 0.4 MG SL tablet, Place 1 tablet (0.4 mg total) under the tongue every 5 (five) minutes as needed for chest pain (up to 3 doses. If taking 3rd dose, call 911)., Disp: 25 tablet, Rfl: 3   Omega-3 Fatty Acids (FISH OIL ) 1000 MG CAPS, Take 1 capsule (1,000 mg total) by mouth in the morning and at bedtime., Disp: , Rfl: 0   sildenafil  (VIAGRA ) 100 MG tablet, Take 0.5-1 tablets (50-100 mg total) by mouth daily as needed for erectile dysfunction., Disp: 90 tablet,  Rfl: 1   tirzepatide  (MOUNJARO ) 5 MG/0.5ML Pen, Inject 5 mg into the skin once a week., Disp: 6 mL, Rfl: 1   carvedilol  (COREG ) 6.25 MG tablet, Take 1 tablet (6.25 mg total) by mouth 2 (two) times daily with a meal., Disp: 180 tablet, Rfl: 1  Allergies  Allergen Reactions   Metformin  And Related Diarrhea    Objective:   BP (!) 140/89   Pulse 83   Temp 97.8 F (36.6 C)   Ht 5' 7 (1.702 m)   Wt 231 lb 3.2 oz (104.9 kg)   SpO2 97%   BMI 36.21 kg/m      05/26/2024    3:41 PM 05/17/2024    8:30 AM 02/24/2024    2:36 PM  Vitals with BMI  Height 5' 7 5' 7 5' 7  Weight 231 lbs 3 oz 220 lbs    BMI 36.2 34.45   Systolic 140  137  Diastolic 89  82  Pulse 83  79     Physical Exam Vitals and nursing note reviewed.  Constitutional:      Appearance: Normal appearance. He is normal weight.  HENT:     Head: Normocephalic and atraumatic.  Cardiovascular:     Rate and Rhythm: Normal rate and regular rhythm.     Pulses: Normal pulses.     Heart sounds: Normal heart sounds.  Pulmonary:     Effort: Pulmonary effort is normal.     Breath sounds: Normal breath sounds.  Skin:    General: Skin is warm and dry.     Capillary Refill: Capillary refill takes less than 2 seconds.  Neurological:     General: No focal deficit present.     Mental Status: He is alert and oriented to person, place, and time. Mental status is at baseline.  Psychiatric:        Mood and Affect: Mood normal.        Behavior: Behavior normal.        Thought Content: Thought content normal.        Judgment: Judgment normal.     Assessment & Plan:  Type 2 diabetes mellitus with obesity (HCC) Assessment & Plan: A1c today. Last A1c 7.7% . Home BG at goal. Continue Jardiance  and Mounjaro   5mg  weekly and increase as tolerated A1c and uACR UTD. Foot exam UTD. Vaccines UTD. Retinal eye exam due. Recommend heart healthy diet such as Mediterranean diet with whole grains, fruits, vegetable, fish, lean meats, nuts, and olive oil. Limit salt. Encouraged moderate walking, 3-5 times/week for 30-50 minutes each session. Aim for at least 150 minutes.week. Goal should be pace of 3 miles/hours, or walking 1.5 miles in 30 minutes. Seek medical care for urinary frequency, extreme thirst, vision changes, lightheadedness, dizziness.  Follow up in 3 months  Orders: -     Hemoglobin A1c -     Lipid panel  Hyperlipidemia LDL goal <70 Assessment & Plan: Lipid level today  Orders: -     Hemoglobin A1c -     Lipid panel  Primary hypertension Assessment & Plan: At goal <130/80. Continue Amlodipine  10mg  daily, Carvedilol  6.25mg   daily. He will check at home if he is taking Losartan  50mg  daily when he gets home and report to office, plan to restart or increase this.  Recommend heart healthy diet such as Mediterranean diet with whole grains, fruits, vegetable, fish, lean meats, nuts, and olive oil. Limit salt. Encouraged moderate walking, 3-5 times/week for 30-50 minutes each session. Aim  for at least 150 minutes.week. Goal should be pace of 3 miles/hours, or walking 1.5 miles in 30 minutes. Avoid tobacco products. Avoid excess alcohol. Take medications as prescribed and bring medications and blood pressure log with cuff to each office visit. Seek medical care for chest pain, palpitations, shortness of breath with exertion, dizziness/lightheadedness, vision changes, recurrent headaches, or swelling of extremities. Follow up in 1 month   CAD in native artery Assessment & Plan: No chest pain, working on BP control, A1c and lipids today   Gastroesophageal reflux disease without esophagitis Assessment & Plan: Continue Lansoprazole 15mg  daily. Continue anti-reflux measures such as raising the head of the bed, avoiding tight clothing or belts, avoiding eating late at night and not lying down shortly after mealtime and achieving weight loss are discussed. Avoid ASA, NSAID's, caffeine, peppermints, alcohol and tobacco.Alert me if there are persistent symptoms, dysphagia, weight loss or GI bleeding.    Other orders -     Empagliflozin ; Take 1 tablet (10 mg total) by mouth daily.  Dispense: 90 tablet; Refill: 3 -     Carvedilol ; Take 1 tablet (6.25 mg total) by mouth 2 (two) times daily with a meal.  Dispense: 180 tablet; Refill: 1     Follow up plan: Return in about 3 weeks (around 06/16/2024) for hypertension.  Jeoffrey GORMAN Barrio, FNP

## 2024-05-26 NOTE — Assessment & Plan Note (Signed)
Lipid level today.

## 2024-05-26 NOTE — Assessment & Plan Note (Signed)
 At goal <130/80. Continue Amlodipine  10mg  daily, Carvedilol  6.25mg  daily. He will check at home if he is taking Losartan  50mg  daily when he gets home and report to office, plan to restart or increase this.  Recommend heart healthy diet such as Mediterranean diet with whole grains, fruits, vegetable, fish, lean meats, nuts, and olive oil. Limit salt. Encouraged moderate walking, 3-5 times/week for 30-50 minutes each session. Aim for at least 150 minutes.week. Goal should be pace of 3 miles/hours, or walking 1.5 miles in 30 minutes. Avoid tobacco products. Avoid excess alcohol. Take medications as prescribed and bring medications and blood pressure log with cuff to each office visit. Seek medical care for chest pain, palpitations, shortness of breath with exertion, dizziness/lightheadedness, vision changes, recurrent headaches, or swelling of extremities. Follow up in 1 month

## 2024-05-26 NOTE — Assessment & Plan Note (Signed)
 No chest pain, working on BP control, A1c and lipids today

## 2024-05-26 NOTE — Assessment & Plan Note (Signed)
 Continue Lansoprazole 15mg  daily. Continue anti-reflux measures such as raising the head of the bed, avoiding tight clothing or belts, avoiding eating late at night and not lying down shortly after mealtime and achieving weight loss are discussed. Avoid ASA, NSAID's, caffeine, peppermints, alcohol and tobacco.Alert me if there are persistent symptoms, dysphagia, weight loss or GI bleeding.

## 2024-05-27 ENCOUNTER — Ambulatory Visit: Payer: Self-pay | Admitting: Family Medicine

## 2024-05-27 LAB — HEMOGLOBIN A1C
Hgb A1c MFr Bld: 6.3 % — ABNORMAL HIGH (ref <5.7)
Mean Plasma Glucose: 134 mg/dL
eAG (mmol/L): 7.4 mmol/L

## 2024-05-27 LAB — LIPID PANEL
Cholesterol: 175 mg/dL (ref <200)
HDL: 73 mg/dL (ref >=40)
LDL Cholesterol (Calc): 83 mg/dL
Non-HDL Cholesterol (Calc): 102 mg/dL (ref <130)
Total CHOL/HDL Ratio: 2.4 (calc) (ref <5.0)
Triglycerides: 94 mg/dL (ref <150)

## 2024-05-28 ENCOUNTER — Ambulatory Visit: Admitting: Pediatrics

## 2024-05-28 ENCOUNTER — Encounter: Payer: Self-pay | Admitting: Pediatrics

## 2024-05-28 VITALS — BP 144/91 | HR 62 | Resp 17

## 2024-05-28 DIAGNOSIS — Z1211 Encounter for screening for malignant neoplasm of colon: Secondary | ICD-10-CM | POA: Diagnosis present

## 2024-05-28 DIAGNOSIS — D125 Benign neoplasm of sigmoid colon: Secondary | ICD-10-CM | POA: Diagnosis not present

## 2024-05-28 DIAGNOSIS — K635 Polyp of colon: Secondary | ICD-10-CM

## 2024-05-28 DIAGNOSIS — K5730 Diverticulosis of large intestine without perforation or abscess without bleeding: Secondary | ICD-10-CM

## 2024-05-28 DIAGNOSIS — K648 Other hemorrhoids: Secondary | ICD-10-CM

## 2024-05-28 DIAGNOSIS — Z860101 Personal history of adenomatous and serrated colon polyps: Secondary | ICD-10-CM | POA: Diagnosis not present

## 2024-05-28 DIAGNOSIS — D124 Benign neoplasm of descending colon: Secondary | ICD-10-CM | POA: Diagnosis not present

## 2024-05-28 DIAGNOSIS — Z860100 Personal history of colon polyps, unspecified: Secondary | ICD-10-CM

## 2024-05-28 MED ORDER — SODIUM CHLORIDE 0.9 % IV SOLN: 500.0000 mL | INTRAVENOUS | Status: DC

## 2024-05-28 NOTE — Progress Notes (Signed)
 Called to room to assist during endoscopic procedure.  Patient ID and intended procedure confirmed with present staff. Received instructions for my participation in the procedure from the performing physician.

## 2024-05-28 NOTE — Progress Notes (Signed)
 Report given to PACU, vss

## 2024-05-28 NOTE — Progress Notes (Signed)
 0802 BP 158/104, Labetalol  given IV, MD update, vss

## 2024-05-28 NOTE — Progress Notes (Signed)
 Pt's states no medical or surgical changes since previsit or office visit.

## 2024-05-28 NOTE — Patient Instructions (Signed)
 YOU HAD AN ENDOSCOPIC PROCEDURE TODAY AT THE [facility]:   Refer to the procedure report that was given to you for any specific questions about what was found during the examination.  If the procedure report does not answer your questions, please call your gastroenterologist to clarify.  If you requested that your care partner not be given the details of your procedure findings, then the procedure report has been included in a sealed envelope for you to review at your convenience later.  YOU SHOULD EXPECT: Some feelings of bloating in the abdomen. Passage of more gas than usual.  Walking can help get rid of the air that was put into your GI tract during the procedure and reduce the bloating. If you had a lower endoscopy (such as a colonoscopy or flexible sigmoidoscopy) you may notice spotting of blood in your stool or on the toilet paper. If you underwent a bowel prep for your procedure, you may not have a normal bowel movement for a few days.  Please Note:  You might notice some irritation and congestion in your nose or some drainage.  This is from the oxygen used during your procedure.  There is no need for concern and it should clear up in a day or so.  SYMPTOMS TO REPORT IMMEDIATELY:  Following lower endoscopy (colonoscopy or flexible sigmoidoscopy):  Excessive amounts of blood in the stool  Significant tenderness or worsening of abdominal pains  Swelling of the abdomen that is new, acute  Fever of 100F or higher  Await pathology results Return to referring physician Handouts on polyps and hemorrhoids given Resume previous diet   For urgent or emergent issues, a gastroenterologist can be reached at any hour by calling [phone]. Do not use MyChart messaging for urgent concerns.    DIET:  We do recommend a small meal at first, but then you may proceed to your regular diet.  Drink plenty of fluids but you should avoid alcoholic beverages for 24 hours.  ACTIVITY:  You  should plan to take it easy for the rest of today and you should NOT DRIVE or use heavy machinery until tomorrow (because of the sedation medicines used during the test).    FOLLOW UP: Our staff will call the number listed on your records the next business day following your procedure.  We will call around 7:15- 8:00 am to check on you and address any questions or concerns that you may have regarding the information given to you following your procedure. If we do not reach you, we will leave a message.     If any biopsies were taken you will be contacted by phone or by letter within the next 1-3 weeks.  Please call us  at [phone] if you have not heard about the biopsies in 3 weeks.    SIGNATURES/CONFIDENTIALITY: You and/or your care partner have signed paperwork which will be entered into your electronic medical record.  These signatures attest to the fact that that the information above on your After Visit Summary has been reviewed and is understood.  Full responsibility of the confidentiality of this discharge information lies with you and/or your care-partner.

## 2024-05-28 NOTE — Op Note (Signed)
 [facility] Patient Name: Blake Smith Procedure Date: 05/28/2024 7:35 AM MRN: [id number] Endoscopist: Inocente Hausen , MD, [id number] Age: 63 Referring MD:  Date of Birth: 03-Sep-1961 Gender: Male Account #: [id number] Procedure:                Colonoscopy Indications:              High risk colon cancer surveillance: Personal                            history of non-advanced adenoma, Last colonoscopy:                            July 2017 Medicines:                Monitored Anesthesia Care Procedure:                Pre-Anesthesia Assessment:                           - Prior to the procedure, a History and Physical                            was performed, and patient medications and                            allergies were reviewed. The patient's tolerance of                            previous anesthesia was also reviewed. The risks                            and benefits of the procedure and the sedation                            options and risks were discussed with the patient.                            All questions were answered, and informed consent                            was obtained. Prior Anticoagulants: The patient has                            taken no anticoagulant or antiplatelet agents. ASA                            Grade Assessment: III - A patient with severe                            systemic disease. After reviewing the risks and                            benefits, the patient was deemed in satisfactory  condition to undergo the procedure.                           After obtaining informed consent, the colonoscope                            was passed under direct vision. Throughout the                            procedure, the patient's blood pressure, pulse, and                            oxygen saturations were monitored continuously. The                            CF [id number] #[id number] was introduced through the anus                             and advanced to the cecum, identified by                            appendiceal orifice and ileocecal valve. The                            colonoscopy was performed without difficulty. The                            patient tolerated the procedure well. The quality                            of the bowel preparation was good. The ileocecal                            valve, appendiceal orifice, and rectum were                            photographed. Scope In: 8:07:12 AM Scope Out: 8:22:00 AM Scope Withdrawal Time: 0 hours 11 minutes 8 seconds  Total Procedure Duration: 0 hours 14 minutes 48 seconds  Findings:                 The perianal and digital rectal examinations were                            normal. Pertinent negatives include normal                            sphincter tone and no palpable rectal lesions.                           A few medium-mouthed and small-mouthed diverticula                            were found in the sigmoid colon, descending colon,  transverse colon and ascending colon.                           Two sessile polyps were found in the sigmoid colon                            and descending colon. The polyps were 3 to 4 mm in                            size. These polyps were removed with a cold biopsy                            forceps (full polypectomy). Resection and retrieval                            were complete.                           Internal hemorrhoids were found during retroflexion. Complications:            No immediate complications. Estimated blood loss:                            Minimal. Estimated Blood Loss:     Estimated blood loss was minimal. Impression:               - Diverticulosis in the sigmoid colon, in the                            descending colon, in the transverse colon and in                            the ascending colon.                           - Two 3 to 4 mm  polyps in the sigmoid colon and in                            the descending colon, removed with a cold biopsy                            forceps. Resected and retrieved.                           - Internal hemorrhoids. Recommendation:           - Discharge patient to home (ambulatory).                           - Await pathology results.                           - Repeat colonoscopy for surveillance based on                            pathology results.                           -  The findings and recommendations were discussed                            with the patient's family.                           - Return to referring physician.                           - Patient has a contact number available for                            emergencies. The signs and symptoms of potential                            delayed complications were discussed with the                            patient. Return to normal activities tomorrow.                            Written discharge instructions were provided to the                            patient. Inocente Hausen, MD 05/28/2024 8:27:29 AM This report has been signed electronically.

## 2024-05-31 ENCOUNTER — Telehealth: Payer: Self-pay | Admitting: *Deleted

## 2024-05-31 NOTE — Telephone Encounter (Signed)
  Follow up Call-     05/28/2024    7:22 AM  Call back number  Post procedure Call Back phone  # [phone]  Permission to leave phone message Yes     Patient questions:  Do you have a fever, pain , or abdominal swelling? No. Pain Score  0 *  Have you tolerated food without any problems? Yes.    Have you been able to return to your normal activities? Yes.    Do you have any questions about your discharge instructions: Diet   No. Medications  No. Follow up visit  No.  Do you have questions or concerns about your Care? No.  Actions: * If pain score is 4 or above: No action needed, pain <4.

## 2024-06-01 ENCOUNTER — Ambulatory Visit: Payer: Self-pay | Admitting: Pediatrics

## 2024-06-01 LAB — SURGICAL PATHOLOGY

## 2024-06-02 NOTE — Telephone Encounter (Signed)
 Called to confirm not contact made. Lvm for call back. Will try later.

## 2024-06-02 NOTE — Telephone Encounter (Signed)
 Sent my  chart message to confirm dosage change of amlodipine  and possible losartan  at next visit.

## 2024-06-06 ENCOUNTER — Encounter: Payer: Self-pay | Admitting: Family Medicine

## 2024-06-07 ENCOUNTER — Other Ambulatory Visit: Payer: Self-pay

## 2024-06-07 MED ORDER — TIRZEPATIDE 7.5 MG/0.5ML ~~LOC~~ SOAJ: 7.5000 mg | SUBCUTANEOUS | 1 refills | Status: DC

## 2024-06-16 ENCOUNTER — Encounter: Payer: Self-pay | Admitting: Family Medicine

## 2024-06-16 ENCOUNTER — Ambulatory Visit: Admitting: Family Medicine

## 2024-06-16 VITALS — BP 142/76 | HR 82 | Temp 97.8°F | Ht 67.0 in | Wt 225.2 lb

## 2024-06-16 DIAGNOSIS — I10 Essential (primary) hypertension: Secondary | ICD-10-CM | POA: Diagnosis not present

## 2024-06-16 MED ORDER — AMLODIPINE BESYLATE 10 MG PO TABS: 10.0000 mg | ORAL_TABLET | Freq: Every day | ORAL | 1 refills | Status: AC

## 2024-06-16 MED ORDER — CARVEDILOL 12.5 MG PO TABS: 12.5000 mg | ORAL_TABLET | Freq: Two times a day (BID) | ORAL | 1 refills | Status: AC

## 2024-06-16 NOTE — Assessment & Plan Note (Signed)
 Not at goal <130/80. Increase Amlodipine  to 10mg  daily, Carvedilol  6.25mg  daily. Consider restarting Losartan  if not improved at next OV   Recommend heart healthy diet such as Mediterranean diet with whole grains, fruits, vegetable, fish, lean meats, nuts, and olive oil. Limit salt. Encouraged moderate walking, 3-5 times/week for 30-50 minutes each session. Aim for at least 150 minutes.week. Goal should be pace of 3 miles/hours, or walking 1.5 miles in 30 minutes. Avoid tobacco products. Avoid excess alcohol. Take medications as prescribed and bring medications and blood pressure log with cuff to each office visit. Seek medical care for chest pain, palpitations, shortness of breath with exertion, dizziness/lightheadedness, vision changes, recurrent headaches, or swelling of extremities. Follow up in 1 month

## 2024-06-16 NOTE — Progress Notes (Signed)
 Subjective:  HPI: Blake Smith is a 63 y.o. male presenting on 06/16/2024 for Medical Management of Chronic Issues (3 wk f/u for HTN )   HPI Patient is in today for HTN follow-up. Is taking Amlodipine  5mg  daily and Coreg  6.25mg  BID. Has been monitoring at home 4-5 times weekly in AM but does not recall readings. Denies chest pain, palpitations, recurrent headaches, vision changes, lightheadedness, dizziness, dyspnea on exertion, or swelling of extremities.   Review of Systems  All other systems reviewed and are negative.   Relevant past medical history reviewed and updated as indicated.   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     Past Surgical History:  Procedure Laterality Date   BICEPS TENDON REPAIR     COLONOSCOPY     CORONARY STENT INTERVENTION N/A 09/19/2022   Procedure: CORONARY STENT INTERVENTION;  Surgeon: Court Dorn PARAS, MD;  Location: [facility];  Service: Cardiovascular;  Laterality: N/A;   LEFT HEART CATH AND CORONARY ANGIOGRAPHY N/A 09/19/2022   Procedure: LEFT HEART CATH AND CORONARY ANGIOGRAPHY;  Surgeon: Court Dorn PARAS, MD;  Location: [facility];  Service: Cardiovascular;  Laterality: N/A;   ROTATOR CUFF REPAIR     bilateral   TONSILLECTOMY AND ADENOIDECTOMY     undescended testicle     lesion penis removed  63 y/o    Allergies and medications reviewed and updated.   Current Outpatient Medications:    Accu-Chek Softclix Lancets lancets, Use as directed, Disp: 100 each, Rfl: 0   acyclovir  (ZOVIRAX ) 400 MG tablet, Take 1 tablet (400 mg total) by  mouth 3 (three) times daily., Disp: 50 tablet, Rfl: 0   atorvastatin  (LIPITOR) 80 MG tablet, TAKE 1 TABLET(80 MG) BY MOUTH DAILY, Disp: 90 tablet, Rfl: 1   blood glucose meter kit and supplies, Dispense based on patient and insurance preference. Use up to four times daily as directed. (FOR ICD-10 E10.9, E11.9)., Disp: 1 each, Rfl: 0   empagliflozin  (JARDIANCE ) 10 MG TABS tablet, Take 1 tablet (10 mg total) by mouth daily., Disp: 90 tablet, Rfl: 3   empagliflozin  (JARDIANCE ) 10 MG TABS tablet, Take 10 mg by mouth daily., Disp: , Rfl:    glucose blood test strip, Use as directed, Disp: 100 each, Rfl: 0   lansoprazole (PREVACID SOLUTAB) 15 MG disintegrating tablet, Take 15 mg by mouth daily., Disp: , Rfl:    loratadine (CLARITIN) 10 MG tablet, Take 10 mg by mouth daily., Disp: , Rfl:    nitroGLYCERIN  (NITROSTAT ) 0.4 MG SL tablet, Place 1 tablet (0.4 mg total) under the tongue every 5 (five) minutes as needed for chest pain (up to 3 doses. If taking 3rd dose, call 911)., Disp: 25 tablet, Rfl: 3   Omega-3 Fatty Acids (FISH OIL ) 1000 MG CAPS, Take 1 capsule (1,000 mg total) by mouth in the morning and at bedtime., Disp: , Rfl: 0   sildenafil  (VIAGRA ) 100 MG tablet, Take 0.5-1 tablets (50-100 mg total) by mouth daily as needed for erectile dysfunction., Disp: 90 tablet, Rfl: 1   tirzepatide  (MOUNJARO ) 7.5 MG/0.5ML  Pen, Inject 7.5 mg into the skin once a week., Disp: 6 mL, Rfl: 1   amLODipine  (NORVASC ) 10 MG tablet, Take 1 tablet (10 mg total) by mouth daily., Disp: 90 tablet, Rfl: 1   carvedilol  (COREG ) 12.5 MG tablet, Take 1 tablet (12.5 mg total) by mouth 2 (two) times daily with a meal., Disp: 180 tablet, Rfl: 1   cetirizine (ZYRTEC) 10 MG tablet, Take 10 mg by mouth daily., Disp: , Rfl:   Allergies  Allergen Reactions   Metformin  And Related Diarrhea    Objective:   BP (!) 142/76   Pulse 82   Temp 97.8 F (36.6 C)   Ht 5' 7 (1.702 m)   Wt 225 lb 3.2 oz (102.2 kg)   SpO2 98%   BMI 35.27  kg/m      06/16/2024    3:33 PM 05/28/2024    8:45 AM 05/28/2024    8:35 AM  Vitals with BMI  Height 5' 7    Weight 225 lbs 3 oz    BMI 35.26    Systolic 142 144 875  Diastolic 76 91 83  Pulse 82 62 66     Physical Exam Vitals and nursing note reviewed.  Constitutional:      Appearance: Normal appearance. He is normal weight.  HENT:     Head: Normocephalic and atraumatic.  Cardiovascular:     Rate and Rhythm: Normal rate and regular rhythm.     Pulses: Normal pulses.     Heart sounds: Normal heart sounds.  Pulmonary:     Effort: Pulmonary effort is normal.     Breath sounds: Normal breath sounds.  Skin:    General: Skin is warm and dry.     Capillary Refill: Capillary refill takes less than 2 seconds.  Neurological:     General: No focal deficit present.     Mental Status: He is alert and oriented to person, place, and time. Mental status is at baseline.  Psychiatric:        Mood and Affect: Mood normal.        Behavior: Behavior normal.        Thought Content: Thought content normal.        Judgment: Judgment normal.     Assessment & Plan:  Primary hypertension Assessment & Plan: Not at goal <130/80. Increase Amlodipine  to 10mg  daily, Carvedilol  6.25mg  daily. Consider restarting Losartan  if not improved at next OV   Recommend heart healthy diet such as Mediterranean diet with whole grains, fruits, vegetable, fish, lean meats, nuts, and olive oil. Limit salt. Encouraged moderate walking, 3-5 times/week for 30-50 minutes each session. Aim for at least 150 minutes.week. Goal should be pace of 3 miles/hours, or walking 1.5 miles in 30 minutes. Avoid tobacco products. Avoid excess alcohol. Take medications as prescribed and bring medications and blood pressure log with cuff to each office visit. Seek medical care for chest pain, palpitations, shortness of breath with exertion, dizziness/lightheadedness, vision changes, recurrent headaches, or swelling of  extremities. Follow up in 1 month   Other orders -     Carvedilol ; Take 1 tablet (12.5 mg total) by mouth 2 (two) times daily with a meal.  Dispense: 180 tablet; Refill: 1 -     amLODIPine  Besylate; Take 1 tablet (10 mg total) by mouth daily.  Dispense: 90 tablet; Refill: 1     Follow up plan: Return in about 4 weeks (around 07/14/2024) for hypertension.  Jeoffrey GORMAN Barrio, FNP

## 2024-06-21 ENCOUNTER — Other Ambulatory Visit (HOSPITAL_COMMUNITY): Payer: Self-pay

## 2024-06-27 ENCOUNTER — Encounter: Payer: Self-pay | Admitting: Family Medicine

## 2024-06-28 ENCOUNTER — Other Ambulatory Visit: Payer: Self-pay

## 2024-06-28 MED ORDER — TIRZEPATIDE 7.5 MG/0.5ML ~~LOC~~ SOAJ: 7.5000 mg | SUBCUTANEOUS | 1 refills | Status: DC

## 2024-06-29 ENCOUNTER — Other Ambulatory Visit: Payer: Self-pay | Admitting: Family Medicine

## 2024-06-29 MED ORDER — TIRZEPATIDE 10 MG/0.5ML ~~LOC~~ SOAJ: 10.0000 mg | SUBCUTANEOUS | 0 refills | Status: DC

## 2024-06-30 ENCOUNTER — Other Ambulatory Visit: Payer: Self-pay | Admitting: Family Medicine

## 2024-06-30 MED ORDER — ATORVASTATIN CALCIUM 80 MG PO TABS: 80.0000 mg | ORAL_TABLET | Freq: Every day | ORAL | 1 refills | Status: AC

## 2024-07-08 ENCOUNTER — Telehealth: Payer: Self-pay

## 2024-07-08 NOTE — Telephone Encounter (Signed)
 Copied from CRM #[id number]. Topic: Clinical - Request for Lab/Test Order >> Jul 08, 2024 12:06 PM Winona SAUNDERS wrote: Pt would like to know if he need labs prior to his appointment on 11/04

## 2024-07-14 ENCOUNTER — Ambulatory Visit: Admitting: Family Medicine

## 2024-07-20 ENCOUNTER — Encounter (HOSPITAL_COMMUNITY): Payer: Self-pay

## 2024-07-20 ENCOUNTER — Other Ambulatory Visit: Payer: Self-pay

## 2024-07-20 ENCOUNTER — Emergency Department (HOSPITAL_COMMUNITY)

## 2024-07-20 ENCOUNTER — Emergency Department (HOSPITAL_COMMUNITY)
Admission: EM | Admit: 2024-07-20 | Discharge: 2024-07-20 | Disposition: A | Discharge status: Home / Self Care | Attending: Emergency Medicine | Admitting: Emergency Medicine

## 2024-07-20 DIAGNOSIS — R072 Precordial pain: Secondary | ICD-10-CM | POA: Diagnosis present

## 2024-07-20 DIAGNOSIS — Z955 Presence of coronary angioplasty implant and graft: Secondary | ICD-10-CM | POA: Insufficient documentation

## 2024-07-20 DIAGNOSIS — E876 Hypokalemia: Secondary | ICD-10-CM | POA: Insufficient documentation

## 2024-07-20 DIAGNOSIS — I2510 Atherosclerotic heart disease of native coronary artery without angina pectoris: Secondary | ICD-10-CM | POA: Insufficient documentation

## 2024-07-20 DIAGNOSIS — E119 Type 2 diabetes mellitus without complications: Secondary | ICD-10-CM | POA: Insufficient documentation

## 2024-07-20 DIAGNOSIS — R110 Nausea: Secondary | ICD-10-CM | POA: Insufficient documentation

## 2024-07-20 DIAGNOSIS — R079 Chest pain, unspecified: Secondary | ICD-10-CM

## 2024-07-20 LAB — CBC
HCT: 45.4 % (ref 39.0–52.0)
Hemoglobin: 16.2 g/dL (ref 13.0–17.0)
MCH: 33 pg (ref 26.0–34.0)
MCHC: 35.7 g/dL (ref 30.0–36.0)
MCV: 92.5 fL (ref 80.0–100.0)
Platelets: 199 10*3/uL (ref 150–400)
RBC: 4.91 MIL/uL (ref 4.22–5.81)
RDW: 11.9 % (ref 11.5–15.5)
WBC: 7.9 10*3/uL (ref 4.0–10.5)
nRBC: 0 % (ref 0.0–0.2)

## 2024-07-20 LAB — BASIC METABOLIC PANEL WITH GFR
Anion gap: 14 (ref 5–15)
BUN: 12 mg/dL (ref 8–23)
CO2: 24 mmol/L (ref 22–32)
Calcium: 9.3 mg/dL (ref 8.9–10.3)
Chloride: 100 mmol/L (ref 98–111)
Creatinine, Ser: 0.86 mg/dL (ref 0.61–1.24)
GFR, Estimated: >60 mL/min (ref >60)
Glucose, Bld: 124 mg/dL — ABNORMAL HIGH (ref 70–99)
Potassium: 3.2 mmol/L — ABNORMAL LOW (ref 3.5–5.1)
Sodium: 138 mmol/L (ref 135–145)

## 2024-07-20 LAB — PROTIME-INR
INR: 1 (ref 0.8–1.2)
Prothrombin Time: 13.3 s (ref 11.4–15.2)

## 2024-07-20 LAB — TROPONIN I (HIGH SENSITIVITY)
Troponin I (High Sensitivity): 7 ng/L
Troponin I (High Sensitivity): 7 ng/L (ref <18)

## 2024-07-20 MED ORDER — ALUM & MAG HYDROXIDE-SIMETH 200-200-20 MG/5ML PO SUSP
30.0000 mL | Freq: Once | ORAL | Status: AC
  Administered 2024-07-20: 30 mL via ORAL
  Filled 2024-07-20: qty 30

## 2024-07-20 MED ORDER — FAMOTIDINE IN NACL 20-0.9 MG/50ML-% IV SOLN
20.0000 mg | Freq: Once | INTRAVENOUS | Status: AC
  Administered 2024-07-20: 20 mg via INTRAVENOUS
  Filled 2024-07-20: qty 50

## 2024-07-20 MED ORDER — LIDOCAINE VISCOUS HCL 2 % MT SOLN
15.0000 mL | Freq: Once | OROMUCOSAL | Status: AC
  Administered 2024-07-20: 15 mL via OROMUCOSAL
  Filled 2024-07-20: qty 15

## 2024-07-20 NOTE — ED Notes (Signed)
 CCM notified that patient is on a monitor .

## 2024-07-20 NOTE — ED Triage Notes (Signed)
 Patient BIB [facility] from home with complaint of waking up with chest pain, described as pressure with reflux. Denies SOB.

## 2024-07-20 NOTE — ED Provider Notes (Signed)
 [place] [facility] Provider Note   CSN: [id number] Arrival date & time: 07/20/24  9789     Patient presents with: Chest Pain   Blake Smith is a 63 y.o. male patient with history of hyperlipidemia, coronary artery disease with stent placement last year, type 2 diabetes who presents to the emergency department today for further evaluation of left-sided substernal chest pain that woke him up from sleep roughly an hour ago.  Patient states he had couple MIX drinks before bed.  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  He states that he did some exercises today he had no difficulty.  Chest pain does not radiate.  Denies any shortness of breath but does endorse some nausea.  He describes the chest pain as pressure and burning.  Patient does have a history of GERD but has not had any problems in a while.  Denies any leg pain or swelling.  No cough, congestion.    Chest Pain      Prior to Admission medications   Medication Sig Start Date End Date Taking? Authorizing Provider  amLODipine  (NORVASC ) 10 MG tablet Take 1 tablet (10 mg total) by mouth daily. 06/16/24  Yes Kayla Gauze S, FNP  atorvastatin  (LIPITOR) 80 MG tablet Take 1 tablet (80 mg total) by mouth daily. 06/30/24  Yes Kayla Gauze RAMAN, FNP  carvedilol  (COREG ) 12.5 MG tablet Take 1 tablet (12.5 mg total) by mouth 2 (two) times daily with a meal. 06/16/24  Yes Kayla, [facility] S, FNP  empagliflozin  (JARDIANCE ) 10 MG TABS tablet Take 1 tablet (10 mg total) by mouth daily. 05/26/24  Yes Howard, Amber S, FNP  lansoprazole (PREVACID SOLUTAB) 15 MG disintegrating tablet Take 15 mg by mouth daily.   Yes [provider]  loratadine (CLARITIN) 10 MG tablet Take 10 mg by mouth daily.   Yes [provider]  Omega-3 Fatty Acids (FISH OIL ) 1000 MG CAPS Take 1 capsule (1,000 mg total) by mouth in the morning and at bedtime. Patient taking differently: Take 1 capsule by mouth daily. 03/03/23   Yes Parthenia Olivia HERO, PA-C  tirzepatide  (MOUNJARO ) 10 MG/0.5ML Pen Inject 10 mg into the skin once a week. 06/29/24  Yes Kayla Gauze RAMAN, FNP  Accu-Chek Softclix Lancets lancets Use as directed 09/20/22     acyclovir  (ZOVIRAX ) 400 MG tablet Take 1 tablet (400 mg total) by mouth 3 (three) times daily. Patient not taking: Reported on 07/20/2024 12/31/23   Rolinda Rogue, MD  blood glucose meter kit and supplies Dispense based on patient and insurance preference. Use up to four times daily as directed. (FOR ICD-10 E10.9, E11.9). 09/20/22   Dunn, Dayna N, PA-C  glucose blood test strip Use as directed 09/20/22     sildenafil  (VIAGRA ) 100 MG tablet Take 0.5-1 tablets (50-100 mg total) by mouth daily as needed for erectile dysfunction. 03/01/24   Kayla Gauze RAMAN, FNP    Allergies: Metformin  and related    Review of Systems  Cardiovascular:  Positive for chest pain.  All other systems reviewed and are negative.   Updated Vital Signs BP (!) 132/90   Pulse 85   Temp 97.7 F (36.5 C) (Oral)   Resp 16   Ht 5' 7 (1.702 m)   Wt 95.3 kg   SpO2 97%   BMI 32.89 kg/m   Physical Exam Vitals and nursing note reviewed.  Constitutional:      General: He is not in acute distress.  Appearance: Normal appearance.  HENT:     Head: Normocephalic and atraumatic.  Eyes:     General:        Right eye: No discharge.        Left eye: No discharge.  Cardiovascular:     Comments: Regular rate and rhythm.  S1/S2 are distinct without any evidence of murmur, rubs, or gallops.  Radial pulses are 2+ bilaterally.  Dorsalis pedis pulses are 2+ bilaterally.  No evidence of pedal edema. Pulmonary:     Comments: Clear to auscultation bilaterally.  Normal effort.  No respiratory distress.  No evidence of wheezes, rales, or rhonchi heard throughout. Abdominal:     General: Abdomen is flat. Bowel sounds are normal. There is no distension.     Tenderness: There is no abdominal tenderness. There is no guarding or  rebound.  Musculoskeletal:        General: Normal range of motion.     Cervical back: Neck supple.  Skin:    General: Skin is warm and dry.     Findings: No rash.  Neurological:     General: No focal deficit present.     Mental Status: He is alert.  Psychiatric:        Mood and Affect: Mood normal.        Behavior: Behavior normal.     (all labs ordered are listed, but only abnormal results are displayed) Labs Reviewed  BASIC METABOLIC PANEL WITH GFR - Abnormal; Notable for the following components:      Result Value   Potassium 3.2 (*)    Glucose, Bld 124 (*)    All other components within normal limits  CBC  PROTIME-INR  TROPONIN I (HIGH SENSITIVITY)  TROPONIN I (HIGH SENSITIVITY)    EKG: EKG Interpretation Date/Time:  Tuesday July 20 2024 02:17:30 EST Ventricular Rate:  75 PR Interval:  210 QRS Duration:  156 QT Interval:  436 QTC Calculation: 486 R Axis:   43  Text Interpretation: Sinus rhythm with 1st degree A-V block Right bundle branch block Inferior infarct , age undetermined Abnormal ECG When compared with ECG of 20-Sep-2022 06:24, PREVIOUS ECG IS PRESENT Confirmed by Theadore Sharper [phone]) on 07/20/2024 5:40:02 AM  Radiology: [facility] Chest 2 View Result Date: 07/20/2024 EXAM: 2 VIEW(S) XRAY OF THE CHEST 07/20/2024 02:31:13 AM COMPARISON: 09/18/2022. CLINICAL HISTORY: chest pain FINDINGS: LUNGS AND PLEURA: Low lung volumes. No focal pulmonary opacity. No pulmonary edema. No pleural effusion. No pneumothorax. HEART AND MEDIASTINUM: No acute abnormality of the cardiac and mediastinal silhouettes. BONES AND SOFT TISSUES: No acute osseous abnormality. IMPRESSION: 1. No acute cardiopulmonary process. Electronically signed by: Pinkie Pebbles MD 07/20/2024 02:34 AM EST RP Workstation: [id number]    Procedures   Medications Ordered in the ED  alum & mag hydroxide-simeth (MAALOX/MYLANTA) 200-200-20 MG/5ML suspension 30 mL (30 mLs Oral Given 07/20/24 0310)  lidocaine   (XYLOCAINE ) 2 % viscous mouth solution 15 mL (15 mLs Mouth/Throat Given 07/20/24 0310)  famotidine (PEPCID) IVPB 20 mg premix (0 mg Intravenous Stopped 07/20/24 0343)    Clinical Course as of 07/20/24 0550  Tue Jul 20, 2024  0336 CBC Negative. [CF]  0336 Protime-INR Negative. [CF]  [id number] Basic metabolic panel(!) Mild hypokalemia. [CF]  [id number] Troponin I (High Sensitivity) Initial troponin is normal. [CF]  0531 Troponin I (High Sensitivity) Delta troponin is normal. [CF]    Clinical Course User Index [CF] Theotis Cameron HERO, PA-C    Medical Decision Making TYRIN HERBERS is a 63 y.o.  male patient who presents to the emergency department today for further evaluation of chest pain.  Could be related to reflux versus atypical ACS.  Vital signs are normal.  Patient is having some burning.  Will give him some medications for refluxn.  Will also get some cardiac enzymes, EKG, chest x-ray, basic labs.  Exam without evidence of volume overload so doubt heart failure. EKG without signs of active ischemia. Given the timing of pain to ER presentation, single troponin normal delta troponin was normal so doubt NSTEMI. Presentation not consistent with acute PE ,pneumothorax (not visualized on chest xr), thoracic aortic dissection, pericarditis, tamponade, pneumonia (no infectious symptoms, clear chest xr), myocarditis (no recent illness, neg trop). HEART score: 3 so plan to discharge patient home with PMD follow up. Likely related to reflux.  Amount and/or Complexity of Data Reviewed Labs: ordered. Decision-making details documented in ED Course. Radiology: ordered.  Risk OTC drugs. Prescription drug management.     Final diagnoses:  Chest pain, unspecified type    ED Discharge Orders     None          Theotis Peers [place], PA-C 07/20/24 0550    Theadore Ozell HERO, MD 07/20/24 [phone]

## 2024-07-20 NOTE — ED Notes (Signed)
 Patient transported to X-ray

## 2024-07-20 NOTE — Discharge Instructions (Signed)
 Please follow-up with your cardiologist for further evaluation.  I would like for you to return to the emergency department for any worsening symptoms.

## 2024-07-30 ENCOUNTER — Encounter: Payer: Self-pay | Admitting: Cardiovascular Disease

## 2024-09-06 ENCOUNTER — Telehealth: Payer: Self-pay | Admitting: Cardiovascular Disease

## 2024-09-06 NOTE — Telephone Encounter (Signed)
 I called patient and left a message form him to return my call regarding the [employer] form.  He has not been here since 10-04-2022 so he may need to make an appointment before the form can be completed.  Please advise on this.

## 2024-09-07 NOTE — Telephone Encounter (Signed)
 Good afternoon.  Please give me a call regarding your [employer] Critical Illness form.    We need more information from you regarding the illness we're talking about.  Also, you will need to sign a Release of Information and pay the $29 charge for completing the form. Best number to reach me is [phone].  Damien ORN.

## 2024-09-08 ENCOUNTER — Telehealth: Payer: Self-pay | Admitting: Cardiovascular Disease

## 2024-09-08 NOTE — Telephone Encounter (Signed)
 We received a Critical Illness form from [employer]. Patient signed the Release of Information and paid $29 fee.  Form in Dr. Margurite box.

## 2024-09-13 NOTE — Telephone Encounter (Signed)
 Reviewed his chart. I haven't seen him in a long time. Suspect this form is related to his recent ER visit. He will need a visit with me or an APP if we are going to fill out paperwork. At least would need to get some details of what is needed. Thx

## 2024-09-20 ENCOUNTER — Other Ambulatory Visit: Payer: Self-pay | Admitting: Family Medicine

## 2024-09-23 ENCOUNTER — Ambulatory Visit: Admitting: Cardiovascular Disease

## 2024-09-26 ENCOUNTER — Emergency Department (HOSPITAL_BASED_OUTPATIENT_CLINIC_OR_DEPARTMENT_OTHER): Admitting: Radiology

## 2024-09-26 ENCOUNTER — Emergency Department (HOSPITAL_BASED_OUTPATIENT_CLINIC_OR_DEPARTMENT_OTHER)
Admission: EM | Admit: 2024-09-26 | Discharge: 2024-09-26 | Disposition: A | Discharge status: Home / Self Care | Attending: Emergency Medicine | Admitting: Emergency Medicine

## 2024-09-26 ENCOUNTER — Encounter (HOSPITAL_BASED_OUTPATIENT_CLINIC_OR_DEPARTMENT_OTHER): Payer: Self-pay

## 2024-09-26 ENCOUNTER — Other Ambulatory Visit: Payer: Self-pay

## 2024-09-26 DIAGNOSIS — M791 Myalgia, unspecified site: Secondary | ICD-10-CM

## 2024-09-26 DIAGNOSIS — Y92410 Unspecified street and highway as the place of occurrence of the external cause: Secondary | ICD-10-CM | POA: Insufficient documentation

## 2024-09-26 DIAGNOSIS — R0789 Other chest pain: Secondary | ICD-10-CM | POA: Insufficient documentation
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
## 2024-09-26 NOTE — ED Notes (Signed)
 7:03 PM  Report received from previous RN. This RN assumes care of the patient.
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
## 2024-09-28 ENCOUNTER — Ambulatory Visit: Admitting: Physician Assistant

## 2024-10-07 NOTE — Telephone Encounter (Signed)
 Patient had an appointment with Dr. Wonda on 09/23/24 and with Dayna Dunn on 09/28/24 and canceled both. Patient has to be seen before his Critical Illness form can be filled out.  He has now rescheduled his appointment with Dayna for 10/29/24 at which time Dayna can complete his form.

## 2024-10-29 ENCOUNTER — Ambulatory Visit: Admitting: Physician Assistant
